# Patient Record
Sex: Female | Born: 1960 | Hispanic: No | Marital: Married | Smoking: Never smoker
Health system: Southern US, Community
[De-identification: ages and names within clinical notes are randomized; demographics above are authoritative.]

## PROBLEM LIST (undated history)

## (undated) DIAGNOSIS — Z789 Other specified health status: Secondary | ICD-10-CM

---

## 2000-10-21 ENCOUNTER — Other Ambulatory Visit: Admission: RE | Admit: 2000-10-21 | Discharge: 2000-10-21 | Payer: Self-pay | Admitting: Obstetrics & Gynecology

## 2001-02-04 ENCOUNTER — Ambulatory Visit (HOSPITAL_COMMUNITY): Admission: RE | Admit: 2001-02-04 | Discharge: 2001-02-04 | Payer: Self-pay | Admitting: Obstetrics & Gynecology

## 2001-04-23 ENCOUNTER — Inpatient Hospital Stay (HOSPITAL_COMMUNITY): Admission: AD | Admit: 2001-04-23 | Discharge: 2001-04-25 | Payer: Self-pay | Admitting: Obstetrics & Gynecology

## 2006-02-07 ENCOUNTER — Ambulatory Visit: Payer: Self-pay | Admitting: Family Medicine

## 2007-04-15 ENCOUNTER — Inpatient Hospital Stay (HOSPITAL_COMMUNITY): Admission: AD | Admit: 2007-04-15 | Discharge: 2007-04-16 | Payer: Self-pay | Admitting: Obstetrics & Gynecology

## 2007-04-24 ENCOUNTER — Ambulatory Visit: Payer: Self-pay | Admitting: Obstetrics & Gynecology

## 2007-04-24 ENCOUNTER — Ambulatory Visit (HOSPITAL_COMMUNITY): Admission: RE | Admit: 2007-04-24 | Discharge: 2007-04-24 | Payer: Self-pay | Admitting: Obstetrics and Gynecology

## 2007-04-24 ENCOUNTER — Encounter: Payer: Self-pay | Admitting: Obstetrics & Gynecology

## 2007-05-27 ENCOUNTER — Encounter: Payer: Self-pay | Admitting: Obstetrics & Gynecology

## 2007-05-27 ENCOUNTER — Ambulatory Visit: Payer: Self-pay | Admitting: Obstetrics & Gynecology

## 2007-06-15 ENCOUNTER — Ambulatory Visit (HOSPITAL_COMMUNITY): Admission: RE | Admit: 2007-06-15 | Discharge: 2007-06-15 | Payer: Self-pay | Admitting: Gynecology

## 2010-01-23 ENCOUNTER — Emergency Department (HOSPITAL_COMMUNITY): Admission: EM | Admit: 2010-01-23 | Discharge: 2010-01-23 | Payer: Self-pay | Admitting: Emergency Medicine

## 2010-01-23 ENCOUNTER — Ambulatory Visit: Payer: Self-pay | Admitting: Vascular Surgery

## 2010-08-02 LAB — URINALYSIS, ROUTINE W REFLEX MICROSCOPIC
Bilirubin Urine: NEGATIVE
Ketones, ur: NEGATIVE mg/dL
Protein, ur: NEGATIVE mg/dL
Specific Gravity, Urine: 1.006 (ref 1.005–1.030)
Urobilinogen, UA: 0.2 mg/dL (ref 0.0–1.0)

## 2010-08-02 LAB — BASIC METABOLIC PANEL
Calcium: 8.9 mg/dL (ref 8.4–10.5)
Chloride: 107 mEq/L (ref 96–112)
GFR calc Af Amer: >60 mL/min (ref >60)
Glucose, Bld: 92 mg/dL (ref 70–99)
Potassium: 3.8 mEq/L (ref 3.5–5.1)

## 2010-08-02 LAB — CBC
Hemoglobin: 12.7 g/dL (ref 12.0–15.0)
MCH: 29.8 pg (ref 26.0–34.0)
RDW: 13.9 % (ref 11.5–15.5)
WBC: 6.5 10*3/uL (ref 4.0–10.5)

## 2010-08-02 LAB — DIFFERENTIAL
Basophils Absolute: 0.1 10*3/uL (ref 0.0–0.1)
Basophils Relative: 2 % — ABNORMAL HIGH (ref 0–1)
Eosinophils Absolute: 0 10*3/uL (ref 0.0–0.7)
Lymphs Abs: 2.5 10*3/uL (ref 0.7–4.0)
Neutro Abs: 3.5 10*3/uL (ref 1.7–7.7)
Neutrophils Relative %: 55 % (ref 43–77)

## 2010-10-02 NOTE — Group Therapy Note (Signed)
[name], [name] NO.:  [id number]   MEDICAL RECORD NO.:  [id number]          PATIENT TYPE:  WOC   LOCATION:  [facility]                   FACILITY:  [facility]   PHYSICIAN:  Johnella Moloney, MD        DATE OF BIRTH:  05/20/1961   DATE OF SERVICE:  05/27/2007                                  CLINIC NOTE   REASON FOR TODAY'S VISIT:  The patient is status post a dilation and  curettage on April 24, 2007, for a 10 week missed abortion.  The  patient is also here for her annual exam.   HISTORY OF PRESENT ILLNESS:  The patient is a 50 year old G86, P 3-0-1-3  who was diagnosed with a 10-week missed abortion in December and  underwent an uncomplicated dilation and curettage on April 24, 2007.  For further details of this surgery, please refer to separate dictated  operative report. Since the surgery, the patient reports having a small  amount of bleeding and denies any pain or any other symptoms. The  patient today is interested in contraception.  She is interested in  using the IUD. All the patient has no other concerns at this time.   PAST MEDICAL HISTORY:  None.   PAST SURGICAL HISTORY:  A D&C on April 24, 2007.   PAST OB/GYN HISTORY:  G4 P3, three vaginal deliveries. Patient with  normal menstrual periods.  No intermenstrual bleeding.  Last menstrual  period was May 21, 2007.   MEDICATIONS:  None.   ALLERGIES:  No known drug allergies.   SOCIAL HISTORY:  The patient lives with her family. She does not work  and she denies any smoking, alcohol or illicit drug use.  Denies any  sexual or physical abuse either currently or in the past.   REVIEW OF SYSTEMS:  On a 14 point review of systems the patient has no  concerns.   PHYSICAL EXAMINATION:  Temperature 99.0, pulse 84, blood pressure  142/92, weight 273 pounds, height 5 feet, 7 inches.  GENERAL: No apparent distress.  LUNGS:  Clear to auscultation bilaterally.  HEART:  Regular rate and rhythm.  BREASTS:  Pendulous, symmetric, no abnormal masses or skin changes seen.  No abnormal drainage.  No axillary lymphadenopathy.  ABDOMEN:  Obese, soft, nontender, nondistended.  PELVIC:  Normal.  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 Otherwise normal anatomy. On speculum exam well rugated, pink.  Vagina multiparous cervical os with small amount of discharge noted from  cervical os consistent with old blood and mucus.  The patient recently  had a menstrual period. Pap smear sample obtained.  Bimanual exam, the  patient noted to have retroverted uterus.  Unable to palpate adnexa  secondary to habitus   ASSESSMENT/PLAN:  The patient is a 50 year old G4, P3-0-1-3 who is  status post a D&C for missed AB on April 24, 2007.  The patient is  also here for annual exam. As for annual exam, Pap smear was done today.  The patient would also be  scheduled for mammogram after this visit. For  her D&C, for her SAB, the patient has opted for contraception at this  point in the form of IUD.  She was given information about the ParaGard  and Mirena.  The patient opts for the Mirena. A gonorrhea, chlamydia  will be reflexively checked from the Pap smear sample. The patient has  been sexually active since her D&C and she was told to call with her  next menstrual period for a Mirena IUD insertion.  The patient  understands that she needs to use contraception.  She says she will be  using condoms in the meantime until she gets a Mirena IUD. For a  postoperative standpoint, the patient is stable.           ______________________________  Johnella Moloney, MD     UD/MEDQ  D:  05/27/2007  T:  05/27/2007  Job:  [id number]

## 2010-10-02 NOTE — Op Note (Signed)
[name], [name]                ACCOUNT NO.:  [id number]   MEDICAL RECORD NO.:  [id number]          PATIENT TYPE:  AMB   LOCATION:  [facility]                           [facility]ACILITY:  [facility]   PHYSICIAN:  Norton Blizzard, MD    DATE OF BIRTH:  1960-12-13   DATE OF PROCEDURE:  04/24/2007  DATE OF DISCHARGE:                               OPERATIVE REPORT   PREOPERATIVE DIAGNOSIS:  Missed abortion at [redacted] weeks gestational age.   POSTOPERATIVE DIAGNOSIS:  Missed abortion at [redacted] weeks gestational age.   PROCEDURE:  Suction dilation and curettage.   SURGEON:  Johnella Moloney, MD.   ANESTHESIA:  General and a local using 0.25% Marcaine with epinephrine,  a total of 20 ml was used.   INTRAVENOUS FLUIDS:  1100 ml of lactated Ringer's.   ESTIMATED BLOOD LOSS:  100 ml.   FINDINGS:  A 10-week size anteverted uterus, a moderate amount of  products of conception.   SPECIMENS:  Products of conception sent to Pathology.   COMPLICATIONS:  None.   CONDITION/DISPOSITION:  Stable to PACU.   PROCEDURE DETAIL:  The patient was a 50 year old female, who was  recently diagnosed with a 10-week missed abortion.  The patient was  given options for management and she chose for surgical evacuation of  the missed abortion.  The risks, benefits, indications, and alternatives  of the procedure were discussed with the patient and informed consent  was obtained.  On the day of surgery, the patient did receive  doxycycline 100 mg IV x1 preoperatively.  She was taken to the operating  room where general anesthesia was placed without difficulty.  She was  then placed in a dorsal lithotomy position and prepped and draped in a  sterile manner.  The patient's bladder was then emptied with the use of  a Foley catheter.  Attention was turned to the patient's pelvis where a  vaginal speculum was placed.  The cervix was identified and the anterior  lip of the cervix was grasped with a tenaculum.  A paracervical block  using 0.25%  Marcaine with epinephrine was administered.  The cervical os  was dilated with Pratt dilators to accommodate the 10 mm curved curette.  The 10 mm curved curette was then advanced gently into the uterine  fundus and a suction device was activated.  The curette was slowly  rotated and used to clear the uterus of all products of conception and  debris.  The uterus was noted to be clamped down and had a gritty  texture.  This was confirmed using sharp curettage, and the suction  curette was then introduced one last time to clear all remaining clots  and debris. The tenaculum was removed from the anterior lip of the  cervix.  There was a mild amount of  bleeding noted, which was controlled using pressure.  Overall good  hemostasis was noted.  All instruments were removed from the patient's  vagina.  The patient tolerated the procedure well.  Sponge, lap, needle,  and instrument counts were correct x2.  She was taken to the recovery  room in stable condition.      Norton Blizzard, MD  Electronically Signed     UAD/MEDQ  D:  04/24/2007  T:  04/24/2007  Job:  [id number]

## 2011-02-25 LAB — RH IMMUNE GLOBULIN WORKUP (NOT WOMEN'S HOSP): Antibody Screen: NEGATIVE

## 2011-02-25 LAB — CBC
HCT: 34 — ABNORMAL LOW
MCHC: 34.9
RDW: 14
WBC: 5.1

## 2011-02-26 LAB — CBC
MCHC: 34.4
MCV: 84.6
Platelets: 391
WBC: 7.4

## 2011-02-26 LAB — WET PREP, GENITAL: Yeast Wet Prep HPF POC: NONE SEEN

## 2011-08-10 ENCOUNTER — Ambulatory Visit (INDEPENDENT_AMBULATORY_CARE_PROVIDER_SITE_OTHER): Payer: BC Managed Care – PPO | Admitting: Family Medicine

## 2011-08-10 VITALS — BP 131/83 | HR 81 | Temp 98.5°F | Resp 20 | Ht 66.0 in | Wt 252.0 lb

## 2011-08-10 DIAGNOSIS — R1013 Epigastric pain: Secondary | ICD-10-CM

## 2011-08-10 DIAGNOSIS — R110 Nausea: Secondary | ICD-10-CM

## 2011-08-10 LAB — COMPREHENSIVE METABOLIC PANEL
ALT: 8 U/L (ref 0–35)
AST: 10 U/L (ref 0–37)
Albumin: 4 g/dL (ref 3.5–5.2)
Glucose, Bld: 100 mg/dL — ABNORMAL HIGH (ref 70–99)
Total Protein: 7.2 g/dL (ref 6.0–8.3)

## 2011-08-10 LAB — POCT CBC
HCT, POC: 34.7 % — AB (ref 37.7–47.9)
Lymph, poc: 1.9 (ref 0.6–3.4)
MCH, POC: 27.4 pg (ref 27–31.2)
MCHC: 31.7 g/dL — AB (ref 31.8–35.4)
MCV: 86.2 fL (ref 80–97)
MID (cbc): 0.3 (ref 0–0.9)
POC Granulocyte: 3.7 (ref 2–6.9)
POC MID %: 5.4 %M (ref 0–12)
RDW, POC: 13.6 %
WBC: 6 10*3/uL (ref 4.6–10.2)

## 2011-08-10 MED ORDER — GI COCKTAIL ~~LOC~~
30.0000 mL | Freq: Once | ORAL | Status: AC
  Administered 2011-08-10: 30 mL via ORAL

## 2011-08-10 MED ORDER — ONDANSETRON 4 MG PO TBDP
4.0000 mg | ORAL_TABLET | Freq: Once | ORAL | Status: AC
  Administered 2011-08-10: 4 mg via ORAL

## 2011-08-10 MED ORDER — PROMETHAZINE HCL 25 MG PO TABS: 25.0000 mg | ORAL_TABLET | Freq: Three times a day (TID) | ORAL | Status: AC | PRN

## 2011-08-10 MED ORDER — ESOMEPRAZOLE MAGNESIUM 40 MG PO CPDR: 40.0000 mg | DELAYED_RELEASE_CAPSULE | Freq: Every day | ORAL | Status: DC

## 2011-08-10 MED ORDER — HYDROCODONE-ACETAMINOPHEN 5-500 MG PO TABS: 1.0000 | ORAL_TABLET | Freq: Three times a day (TID) | ORAL | Status: DC | PRN

## 2011-08-10 NOTE — Progress Notes (Signed)
  Subjective:    Patient ID: [name], female    DOB: 05-06-1961, 51 y.o.   MRN: [id number]  HPI 51 yo healthy  female with abdominal complaints. Abdominal pain, epigastric, severe.  Decreased appetite.  Associated with nausea and diarrhea.  Started 2 nights ago, mild.  Worse since yesterday morning.  Took a tylenol, not much help.  Pain is a pulling, stretching pain.  Feels in back as well.  No alcohol.  No NSAID's.  Normal BM's.  No fever.  Pain does not radiate.  No history of abdominal surgeries.    Review of Systems Negative except as per HPI     Objective:   Physical Exam  Constitutional: Vital signs are normal. She appears well-developed and well-nourished. She is active.  Cardiovascular: Normal rate, regular rhythm, normal heart sounds and normal pulses.   Pulmonary/Chest: Effort normal and breath sounds normal.  Abdominal: Soft. Normal appearance and bowel sounds are normal. She exhibits no distension and no mass. There is no hepatosplenomegaly. There is tenderness in the right upper quadrant and epigastric area. There is no rigidity, no rebound, no guarding, no CVA tenderness, no tenderness at McBurney's point and negative Murphy's sign. No hernia.  Neurological: She is alert.   Results for orders placed in visit on 08/10/11  POCT CBC      Component Value Range   WBC 6.0  4.6 - 10.2 (K/uL)   Lymph, poc 1.9  0.6 - 3.4    POC LYMPH PERCENT 32.3  10 - 50 (%L)   MID (cbc) 0.3  0 - 0.9    POC MID % 5.4  0 - 12 (%M)   POC Granulocyte 3.7  2 - 6.9    Granulocyte percent 62.3  37 - 80 (%G)   RBC 4.02 (*) 4.04 - 5.48 (M/uL)   Hemoglobin 11.0 (*) 12.2 - 16.2 (g/dL)   HCT, POC 95.2 (*) 84.1 - 47.9 (%)   MCV 86.2  80 - 97 (fL)   MCH, POC 27.4  27 - 31.2 (pg)   MCHC 31.7 (*) 31.8 - 35.4 (g/dL)   RDW, POC 32.4     Platelet Count, POC 380  142 - 424 (K/uL)   MPV 8.5  0 - 99.8 (fL)    Did not improve with GI cocktail or zofran.        Assessment & Plan:  Epigastric and RUQ  pain.  Normal CBC.  Gastritis (though did not improve with GI cocktail) vs pancreatitis vs gall bladder disease.  Obtain abd u/s.  Vicodin, nexium and phenergan for symptoms in the meantime.

## 2011-08-16 ENCOUNTER — Ambulatory Visit
Admission: RE | Admit: 2011-08-16 | Discharge: 2011-08-16 | Disposition: A | Discharge status: Home / Self Care | Payer: No Typology Code available for payment source | Source: Ambulatory Visit | Attending: Family Medicine | Admitting: Family Medicine

## 2011-08-16 ENCOUNTER — Encounter (HOSPITAL_COMMUNITY): Admission: EM | Disposition: A | Discharge status: Home / Self Care | Payer: Self-pay | Source: Home / Self Care

## 2011-08-16 ENCOUNTER — Inpatient Hospital Stay (HOSPITAL_COMMUNITY): Payer: BC Managed Care – PPO | Admitting: Anesthesiology

## 2011-08-16 ENCOUNTER — Inpatient Hospital Stay (HOSPITAL_COMMUNITY): Payer: BC Managed Care – PPO

## 2011-08-16 ENCOUNTER — Encounter (HOSPITAL_COMMUNITY): Payer: Self-pay | Admitting: Anesthesiology

## 2011-08-16 ENCOUNTER — Other Ambulatory Visit: Payer: Self-pay

## 2011-08-16 ENCOUNTER — Inpatient Hospital Stay (HOSPITAL_COMMUNITY)
Admission: EM | Admit: 2011-08-16 | Discharge: 2011-08-17 | DRG: 494 | Disposition: A | Discharge status: Home / Self Care | Payer: BC Managed Care – PPO | Attending: Surgery | Admitting: Surgery

## 2011-08-16 ENCOUNTER — Telehealth: Payer: Self-pay | Admitting: Family Medicine

## 2011-08-16 ENCOUNTER — Encounter (HOSPITAL_COMMUNITY): Payer: Self-pay | Admitting: Surgery

## 2011-08-16 DIAGNOSIS — R1013 Epigastric pain: Secondary | ICD-10-CM

## 2011-08-16 DIAGNOSIS — K8000 Calculus of gallbladder with acute cholecystitis without obstruction: Secondary | ICD-10-CM

## 2011-08-16 DIAGNOSIS — K8010 Calculus of gallbladder with chronic cholecystitis without obstruction: Principal | ICD-10-CM | POA: Diagnosis present

## 2011-08-16 HISTORY — PX: CHOLECYSTECTOMY: SHX55

## 2011-08-16 HISTORY — DX: Other specified health status: Z78.9

## 2011-08-16 LAB — CBC
MCV: 86.9 fL (ref 78.0–100.0)
Platelets: 408 10*3/uL — ABNORMAL HIGH (ref 150–400)
RBC: 4.5 MIL/uL (ref 3.87–5.11)
RDW: 12.8 % (ref 11.5–15.5)
WBC: 5.3 10*3/uL (ref 4.0–10.5)

## 2011-08-16 LAB — DIFFERENTIAL
Basophils Absolute: 0 10*3/uL (ref 0.0–0.1)
Lymphocytes Relative: 45 % (ref 12–46)
Lymphs Abs: 2.4 10*3/uL (ref 0.7–4.0)
Neutro Abs: 2.4 10*3/uL (ref 1.7–7.7)
Neutrophils Relative %: 45 % (ref 43–77)

## 2011-08-16 LAB — COMPREHENSIVE METABOLIC PANEL
ALT: 8 U/L (ref 0–35)
AST: 12 U/L (ref 0–37)
Alkaline Phosphatase: 59 U/L (ref 39–117)
CO2: 27 mEq/L (ref 19–32)
Calcium: 9.6 mg/dL (ref 8.4–10.5)
GFR calc Af Amer: >90 mL/min (ref >90)
GFR calc non Af Amer: >90 mL/min (ref >90)
Glucose, Bld: 81 mg/dL (ref 70–99)
Potassium: 3.8 mEq/L (ref 3.5–5.1)
Sodium: 135 mEq/L (ref 135–145)

## 2011-08-16 SURGERY — LAPAROSCOPIC CHOLECYSTECTOMY WITH INTRAOPERATIVE CHOLANGIOGRAM
Anesthesia: General | Site: Abdomen | Wound class: Dirty or Infected

## 2011-08-16 MED ORDER — ACETAMINOPHEN 10 MG/ML IV SOLN
INTRAVENOUS | Status: DC | PRN
  Administered 2011-08-16: 1000 mg via INTRAVENOUS

## 2011-08-16 MED ORDER — SUCCINYLCHOLINE CHLORIDE 20 MG/ML IJ SOLN
INTRAMUSCULAR | Status: DC | PRN
  Administered 2011-08-16: 100 mg via INTRAVENOUS

## 2011-08-16 MED ORDER — MIDAZOLAM HCL 5 MG/5ML IJ SOLN
INTRAMUSCULAR | Status: DC | PRN
  Administered 2011-08-16: 2 mg via INTRAVENOUS

## 2011-08-16 MED ORDER — STERILE WATER FOR IRRIGATION IR SOLN
Status: DC | PRN
  Administered 2011-08-16: 1500 mL

## 2011-08-16 MED ORDER — ONDANSETRON HCL 4 MG/2ML IJ SOLN: 4.0000 mg | Freq: Four times a day (QID) | INTRAMUSCULAR | Status: DC | PRN

## 2011-08-16 MED ORDER — HYDROMORPHONE HCL PF 1 MG/ML IJ SOLN
INTRAMUSCULAR | Status: DC | PRN
  Administered 2011-08-16 (×2): 1 mg via INTRAVENOUS

## 2011-08-16 MED ORDER — HYDROCODONE-ACETAMINOPHEN 10-325 MG PO TABS: 2.0000 | ORAL_TABLET | ORAL | Status: DC | PRN

## 2011-08-16 MED ORDER — HYDROMORPHONE HCL PF 1 MG/ML IJ SOLN
INTRAMUSCULAR | Status: AC
  Administered 2011-08-16: 0.5 mg via INTRAVENOUS
  Filled 2011-08-16: qty 1

## 2011-08-16 MED ORDER — PANTOPRAZOLE SODIUM 40 MG PO TBEC
40.0000 mg | DELAYED_RELEASE_TABLET | Freq: Every day | ORAL | Status: DC
  Administered 2011-08-16: 40 mg via ORAL
  Filled 2011-08-16 (×2): qty 1

## 2011-08-16 MED ORDER — BUPIVACAINE-EPINEPHRINE PF 0.25-1:200000 % IJ SOLN
INTRAMUSCULAR | Status: AC
  Filled 2011-08-16: qty 30

## 2011-08-16 MED ORDER — ACETAMINOPHEN 10 MG/ML IV SOLN
INTRAVENOUS | Status: AC
  Filled 2011-08-16: qty 100

## 2011-08-16 MED ORDER — DROPERIDOL 2.5 MG/ML IJ SOLN
INTRAMUSCULAR | Status: DC | PRN
  Administered 2011-08-16: 0.625 mg via INTRAVENOUS

## 2011-08-16 MED ORDER — IOHEXOL 300 MG/ML  SOLN
INTRAMUSCULAR | Status: AC
  Filled 2011-08-16: qty 1

## 2011-08-16 MED ORDER — ONDANSETRON HCL 4 MG/2ML IJ SOLN
4.0000 mg | Freq: Once | INTRAMUSCULAR | Status: AC
Start: 2011-08-16 — End: 2011-08-16
  Administered 2011-08-16: 4 mg via INTRAVENOUS
  Filled 2011-08-16: qty 2

## 2011-08-16 MED ORDER — NEOSTIGMINE METHYLSULFATE 1 MG/ML IJ SOLN
INTRAMUSCULAR | Status: DC | PRN
  Administered 2011-08-16: 5 mg via INTRAVENOUS

## 2011-08-16 MED ORDER — IOHEXOL 300 MG/ML  SOLN
INTRAMUSCULAR | Status: DC | PRN
  Administered 2011-08-16: 6.5 mL via INTRAVENOUS

## 2011-08-16 MED ORDER — LACTATED RINGERS IV SOLN
INTRAVENOUS | Status: DC | PRN
  Administered 2011-08-16 (×2): via INTRAVENOUS

## 2011-08-16 MED ORDER — LACTATED RINGERS IR SOLN
Status: DC | PRN
  Administered 2011-08-16 (×2): 1000 mL

## 2011-08-16 MED ORDER — 0.9 % SODIUM CHLORIDE (POUR BTL) OPTIME
TOPICAL | Status: DC | PRN
  Administered 2011-08-16: 1000 mL

## 2011-08-16 MED ORDER — MORPHINE SULFATE 2 MG/ML IJ SOLN: 1.0000 mg | INTRAMUSCULAR | Status: DC | PRN

## 2011-08-16 MED ORDER — KCL IN DEXTROSE-NACL 10-5-0.45 MEQ/L-%-% IV SOLN
INTRAVENOUS | Status: DC
  Filled 2011-08-16 (×4): qty 1000

## 2011-08-16 MED ORDER — LACTATED RINGERS IV SOLN: INTRAVENOUS | Status: DC

## 2011-08-16 MED ORDER — FENTANYL CITRATE 0.05 MG/ML IJ SOLN
INTRAMUSCULAR | Status: DC | PRN
  Administered 2011-08-16: 50 ug via INTRAVENOUS
  Administered 2011-08-16 (×2): 100 ug via INTRAVENOUS
  Administered 2011-08-16 (×2): 50 ug via INTRAVENOUS

## 2011-08-16 MED ORDER — KETOROLAC TROMETHAMINE 30 MG/ML IJ SOLN
30.0000 mg | Freq: Four times a day (QID) | INTRAMUSCULAR | Status: DC
  Administered 2011-08-17 (×2): 30 mg via INTRAVENOUS
  Filled 2011-08-16 (×6): qty 1

## 2011-08-16 MED ORDER — HYDROMORPHONE HCL PF 1 MG/ML IJ SOLN
1.0000 mg | Freq: Once | INTRAMUSCULAR | Status: AC
  Administered 2011-08-16: 1 mg via INTRAVENOUS

## 2011-08-16 MED ORDER — KCL IN DEXTROSE-NACL 10-5-0.45 MEQ/L-%-% IV SOLN
INTRAVENOUS | Status: DC
  Administered 2011-08-16: 23:00:00 via INTRAVENOUS
  Filled 2011-08-16 (×3): qty 1000

## 2011-08-16 MED ORDER — HYDROMORPHONE HCL PF 1 MG/ML IJ SOLN
INTRAMUSCULAR | Status: AC
Start: 2011-08-16 — End: 2011-08-16
  Administered 2011-08-16: 1 mg via INTRAVENOUS
  Filled 2011-08-16: qty 1

## 2011-08-16 MED ORDER — ENOXAPARIN SODIUM 40 MG/0.4ML ~~LOC~~ SOLN
40.0000 mg | SUBCUTANEOUS | Status: DC
  Administered 2011-08-17: 40 mg via SUBCUTANEOUS
  Filled 2011-08-16 (×3): qty 0.4

## 2011-08-16 MED ORDER — PROPOFOL 10 MG/ML IV EMUL
INTRAVENOUS | Status: DC | PRN
  Administered 2011-08-16: 150 mg via INTRAVENOUS

## 2011-08-16 MED ORDER — DEXAMETHASONE SODIUM PHOSPHATE 10 MG/ML IJ SOLN
INTRAMUSCULAR | Status: DC | PRN
  Administered 2011-08-16: 10 mg via INTRAVENOUS

## 2011-08-16 MED ORDER — GLYCOPYRROLATE 0.2 MG/ML IJ SOLN
INTRAMUSCULAR | Status: DC | PRN
  Administered 2011-08-16: .6 mg via INTRAVENOUS

## 2011-08-16 MED ORDER — ENOXAPARIN SODIUM 40 MG/0.4ML ~~LOC~~ SOLN: 40.0000 mg | SUBCUTANEOUS | Status: DC

## 2011-08-16 MED ORDER — ONDANSETRON HCL 4 MG/2ML IJ SOLN
INTRAMUSCULAR | Status: DC | PRN
  Administered 2011-08-16: 4 mg via INTRAVENOUS

## 2011-08-16 MED ORDER — HYDROMORPHONE HCL PF 1 MG/ML IJ SOLN
1.0000 mg | INTRAMUSCULAR | Status: DC | PRN
  Administered 2011-08-17: 1 mg via INTRAVENOUS
  Filled 2011-08-16: qty 1

## 2011-08-16 MED ORDER — LIDOCAINE HCL (CARDIAC) 20 MG/ML IV SOLN
INTRAVENOUS | Status: DC | PRN
  Administered 2011-08-16: 100 mg via INTRAVENOUS

## 2011-08-16 MED ORDER — HYDROMORPHONE HCL PF 1 MG/ML IJ SOLN
0.2500 mg | INTRAMUSCULAR | Status: DC | PRN
  Administered 2011-08-16: 0.5 mg via INTRAVENOUS
  Administered 2011-08-16 (×2): 0.25 mg via INTRAVENOUS

## 2011-08-16 MED ORDER — SODIUM CHLORIDE 0.9 % IV SOLN
Freq: Once | INTRAVENOUS | Status: AC
  Administered 2011-08-16: 20 mL via INTRAVENOUS

## 2011-08-16 MED ORDER — SODIUM CHLORIDE 0.9 % IV SOLN
3.0000 g | Freq: Four times a day (QID) | INTRAVENOUS | Status: DC
  Administered 2011-08-16 – 2011-08-17 (×2): 3 g via INTRAVENOUS
  Filled 2011-08-16 (×7): qty 3

## 2011-08-16 MED ORDER — SODIUM CHLORIDE 0.9 % IV SOLN
3.0000 g | Freq: Once | INTRAVENOUS | Status: AC
  Administered 2011-08-16 (×2): 3 g via INTRAVENOUS
  Filled 2011-08-16: qty 3

## 2011-08-16 MED ORDER — ROCURONIUM BROMIDE 100 MG/10ML IV SOLN
INTRAVENOUS | Status: DC | PRN
  Administered 2011-08-16: 5 mg via INTRAVENOUS
  Administered 2011-08-16: 10 mg via INTRAVENOUS
  Administered 2011-08-16: 30 mg via INTRAVENOUS
  Administered 2011-08-16: 10 mg via INTRAVENOUS

## 2011-08-16 MED ORDER — BUPIVACAINE-EPINEPHRINE PF 0.25-1:200000 % IJ SOLN
INTRAMUSCULAR | Status: DC | PRN
  Administered 2011-08-16: 15 mL

## 2011-08-16 SURGICAL SUPPLY — 46 items
APPLIER CLIP ROT 10 11.4 M/L (STAPLE) ×2
CANISTER SUCTION 2500CC (MISCELLANEOUS) ×2 IMPLANT
CHLORAPREP W/TINT 26ML (MISCELLANEOUS) ×4 IMPLANT
CLIP APPLIE ROT 10 11.4 M/L (STAPLE) ×1 IMPLANT
CLOTH BEACON ORANGE TIMEOUT ST (SAFETY) ×2 IMPLANT
COVER MAYO STAND STRL (DRAPES) ×2 IMPLANT
COVER SURGICAL LIGHT HANDLE (MISCELLANEOUS) ×2 IMPLANT
DECANTER SPIKE VIAL GLASS SM (MISCELLANEOUS) IMPLANT
DERMABOND ADVANCED (GAUZE/BANDAGES/DRESSINGS) ×1
DERMABOND ADVANCED .7 DNX12 (GAUZE/BANDAGES/DRESSINGS) ×1 IMPLANT
DISSECTOR BLUNT TIP ENDO 5MM (MISCELLANEOUS) ×2 IMPLANT
DRAIN CHANNEL RND F F (WOUND CARE) ×2 IMPLANT
DRAPE C-ARM 42X72 X-RAY (DRAPES) ×2 IMPLANT
DRAPE LAPAROSCOPIC ABDOMINAL (DRAPES) ×2 IMPLANT
DRAPE UTILITY XL STRL (DRAPES) ×2 IMPLANT
DRAPE WARM FLUID 44X44 (DRAPE) ×2 IMPLANT
ELECT REM PT RETURN 9FT ADLT (ELECTROSURGICAL) ×2
ELECTRODE REM PT RTRN 9FT ADLT (ELECTROSURGICAL) ×1 IMPLANT
EVACUATOR DRAINAGE 10X20 100CC (DRAIN) ×1 IMPLANT
EVACUATOR SILICONE 100CC (DRAIN) ×1
FILTER SMOKE EVAC LAPAROSHD (FILTER) ×2 IMPLANT
GLOVE BIOGEL PI IND STRL 7.0 (GLOVE) ×1 IMPLANT
GLOVE BIOGEL PI INDICATOR 7.0 (GLOVE) ×1
GLOVE INDICATOR 8.0 STRL GRN (GLOVE) ×4 IMPLANT
GLOVE SS BIOGEL STRL SZ 8 (GLOVE) ×1 IMPLANT
GLOVE SUPERSENSE BIOGEL SZ 8 (GLOVE) ×1
GOWN STRL NON-REIN LRG LVL3 (GOWN DISPOSABLE) ×2 IMPLANT
GOWN STRL REIN XL XLG (GOWN DISPOSABLE) ×4 IMPLANT
HEMOSTAT SNOW SURGICEL 2X4 (HEMOSTASIS) ×2 IMPLANT
HEMOSTAT SURGICEL 4X8 (HEMOSTASIS) IMPLANT
KIT BASIN OR (CUSTOM PROCEDURE TRAY) ×2 IMPLANT
POUCH SPECIMEN RETRIEVAL 10MM (ENDOMECHANICALS) ×2 IMPLANT
SCISSORS LAP 5X35 DISP (ENDOMECHANICALS) ×2 IMPLANT
SET CHOLANGIOGRAPH MIX (MISCELLANEOUS) ×2 IMPLANT
SET IRRIG TUBING LAPAROSCOPIC (IRRIGATION / IRRIGATOR) ×2 IMPLANT
SPONGE DRAIN TRACH 4X4 STRL 2S (GAUZE/BANDAGES/DRESSINGS) ×2 IMPLANT
SUT ETHILON 2 0 PS N (SUTURE) ×2 IMPLANT
SUT MNCRL AB 4-0 PS2 18 (SUTURE) ×2 IMPLANT
TAPE CLOTH SURG 4X10 WHT LF (GAUZE/BANDAGES/DRESSINGS) ×2 IMPLANT
TOWEL OR 17X26 10 PK STRL BLUE (TOWEL DISPOSABLE) ×2 IMPLANT
TOWEL OR NON WOVEN STRL DISP B (DISPOSABLE) ×2 IMPLANT
TRAY LAP CHOLE (CUSTOM PROCEDURE TRAY) ×2 IMPLANT
TROCAR BLADELESS OPT 5 75 (ENDOMECHANICALS) ×4 IMPLANT
TROCAR XCEL BLUNT TIP 100MML (ENDOMECHANICALS) ×2 IMPLANT
TROCAR XCEL NON-BLD 11X100MML (ENDOMECHANICALS) ×2 IMPLANT
TUBING INSUFFLATION 10FT LAP (TUBING) ×2 IMPLANT

## 2011-08-16 NOTE — ED Provider Notes (Signed)
History     CSN: [id number]  Arrival date & time 08/16/11  0849   First MD Initiated Contact with Patient 08/16/11 [phone]      No chief complaint on file.   (Consider location/radiation/quality/duration/timing/severity/associated sxs/prior treatment) HPI Comments: Patient presents with 8 days of intermittent right upper quadrant pain that can be severe at times. She's had some episodes of nausea and vomiting with the pain. No fevers. No diarrhea or urinary symptoms. Patient was seen in urgent care and prescribed Phenergan, Vicodin, and Prevacid. Daughter reports that she has been taking the Vicodin every 8 hours consistently. Last attack of pain was a 4 AM. Patient had an ultrasound performed this morning that showed a gallstone in the gallbladder neck with some mild thickening of the gallbladder. No pericholecystic fluid or obvious signs of cholecystitis. Her pain at current time is minimal.  Patient is a 51 y.o. female presenting with abdominal pain. The history is provided by the patient.  Abdominal Pain The primary symptoms of the illness include abdominal pain, nausea and vomiting. The primary symptoms of the illness do not include fever, shortness of breath, diarrhea or dysuria. The current episode started more than 2 days ago. The problem has been gradually worsening.  The patient has not had a change in bowel habit. Symptoms associated with the illness do not include chills, constipation, frequency or back pain. Significant associated medical issues include gallstones.    No past medical history on file.  No past surgical history on file.  No family history on file.  History  Substance Use Topics  . Smoking status: Never Smoker   . Smokeless tobacco: Not on file  . Alcohol Use: Not on file    OB History    Grav Para Term Preterm Abortions TAB SAB Ect Mult Living                  Review of Systems  Constitutional: Negative for fever and chills.  HENT: Negative for sore  throat and rhinorrhea.   Eyes: Negative for redness.  Respiratory: Negative for cough and shortness of breath.   Cardiovascular: Negative for chest pain.  Gastrointestinal: Positive for nausea, vomiting and abdominal pain. Negative for diarrhea and constipation.  Genitourinary: Negative for dysuria and frequency.  Musculoskeletal: Negative for myalgias and back pain.  Skin: Negative for rash.  Neurological: Negative for headaches.    Allergies  Review of patient's allergies indicates no known allergies.  Home Medications   Current Outpatient Rx  Name Route Sig Dispense Refill  . ACETAMINOPHEN 500 MG PO CHEW Oral Chew 500 mg by mouth every 4 (four) hours.    Marland Kitchen HYDROCODONE-ACETAMINOPHEN 5-500 MG PO TABS Oral Take 1 tablet by mouth every 8 (eight) hours as needed. For pain    . LANSOPRAZOLE 30 MG PO CPDR Oral Take 30 mg by mouth daily.    Marland Kitchen PROMETHAZINE HCL 25 MG PO TABS Oral Take 1 tablet (25 mg total) by mouth every 8 (eight) hours as needed for nausea. 20 tablet 0    BP 141/98  Pulse 92  Temp(Src) 97.8 F (36.6 C) (Oral)  Resp 18  SpO2 100%  LMP 08/01/2011  Physical Exam  Nursing note and vitals reviewed. Constitutional: She is oriented to person, place, and time. She appears well-developed and well-nourished.  HENT:  Head: Normocephalic and atraumatic.  Eyes: Conjunctivae are normal. Right eye exhibits no discharge. Left eye exhibits no discharge.  Neck: Normal range of motion. Neck supple.  Cardiovascular: Normal  rate, regular rhythm and normal heart sounds.   Pulmonary/Chest: Effort normal and breath sounds normal.  Abdominal: Soft. Bowel sounds are normal. There is tenderness in the right upper quadrant. There is no rebound, no guarding, no CVA tenderness, no tenderness at McBurney's point and negative Murphy's sign.  Neurological: She is alert and oriented to person, place, and time.  Skin: Skin is warm and dry.  Psychiatric: She has a normal mood and affect.    ED  Course  Procedures (including critical care time)  Labs Reviewed  CBC - Abnormal; Notable for the following:    Platelets 408 (*)    All other components within normal limits  COMPREHENSIVE METABOLIC PANEL - Abnormal; Notable for the following:    Total Protein 8.7 (*)    Total Bilirubin 0.2 (*)    All other components within normal limits  DIFFERENTIAL  LIPASE, BLOOD   US Abdomen Complete  08/16/2011  *RADIOLOGY REPORT*  Clinical Data:  Epigastric abdominal pain.  COMPLETE ABDOMINAL ULTRASOUND  Comparison:  None.  Findings:  Gallbladder:  Numerous gallstones.  These measure up to 3.2 cm.  A 2.3 cm stone is identified within the gallbladder neck.  The gallbladder wall is mildly thickened, at 5 mm.  No pericholecystic fluid. Sonographic Murphy's sign was not elicited.  Common bile duct: Normal, 3 mm.  Liver: Normal in echogenicity, without focal lesion.  IVC: Negative  Pancreas:  Negative  Spleen:  Normal in size and echogenicity.  Right Kidney:  11.1 cm. No hydronephrosis.  Left Kidney:  12.4 cm. No hydronephrosis.  Abdominal aorta:  Nonaneurysmal without ascites.  IMPRESSION:  1.  Gallstones, including a gallbladder neck stone.  Wall thickening, without specific evidence of acute cholecystitis. This study was made a "call report". 2.  No biliary ductal dilatation.  Original Report Authenticated By: Consuello Bossier, M.D.   1. Cholelithiasis     9:19 AM Patient seen and examined. Work-up initiated. Pt refusing medication at this time. Last oral intake last night. Will get labs. Anticipate calling surgery.   Vital signs reviewed and are as follows: Filed Vitals:   08/16/11 0856  BP: 141/98  Pulse: 92  Temp: 97.8 F (36.6 C)  Resp: 18   9:19 AM Patient was discussed with Geoffery Lyons, MD  11:06 AM Spoke with Dr. Luisa Hart. He will see in ED. Asked to give 3g Unasyn.   2:20 PM Surgery has seen and will admit.   MDM  Admit for cholecystectomy.         Renne Crigler, [place] 08/16/11  1420

## 2011-08-16 NOTE — Anesthesia Preprocedure Evaluation (Signed)
Anesthesia Evaluation  Patient identified by MRN, date of birth, ID band Patient awake  General Assessment Comment:NPO today  Reviewed: Allergy & Precautions, H&P , NPO status , Patient's Chart, lab work & pertinent test results, reviewed documented beta blocker date and time   Airway Mallampati: II TM Distance: >3 FB Neck ROM: Full    Dental  (+) Teeth Intact and Dental Advisory Given   Pulmonary neg pulmonary ROS,          Cardiovascular negative cardio ROS  Rhythm:Regular Rate:Normal  Denies cardiac symptoms   Neuro/Psych negative neurological ROS  negative psych ROS   GI/Hepatic Neg liver ROS, gallstones   Endo/Other  Obesity  Renal/GU negative Renal ROS  negative genitourinary   Musculoskeletal negative musculoskeletal ROS (+)   Abdominal   Peds negative pediatric ROS (+)  Hematology negative hematology ROS (+)   Anesthesia Other Findings   Reproductive/Obstetrics negative OB ROS                           Anesthesia Physical Anesthesia Plan  ASA: II  Anesthesia Plan: General   Post-op Pain Management:    Induction: Intravenous and Cricoid pressure planned  Airway Management Planned: Oral ETT  Additional Equipment:   Intra-op Plan:   Post-operative Plan: Extubation in OR  Informed Consent:   Dental advisory given  Plan Discussed with: CRNA and Surgeon  Anesthesia Plan Comments:         Anesthesia Quick Evaluation

## 2011-08-16 NOTE — Consult Note (Signed)
Reason for Consult:Abdominal pain and gallstones Referring Physician: Deloe  [name] is an 51 y.o. female.  HPI: Pt presents from urgent care with 1 day history of epigastric pain and nausea.  Pain is sharp 10/10 located in RUQ and epigastrium without radiation.  Pain made worse with eating.  Has had episodes before.  No past medical history on file.  No past surgical history on file.  No family history on file.  Social History:  reports that she has never smoked. She does not have any smokeless tobacco history on file. Her alcohol and drug histories not on file.  Allergies: No Known Allergies  Medications:  I have reviewed the patient's current medications. Prior to Admission:  (Not in a hospital admission) Scheduled:   . sodium chloride   Intravenous Once  . ampicillin-sulbactam (UNASYN) IV  3 g Intravenous Once  .  HYDROmorphone (DILAUDID) injection  1 mg Intravenous Once  . ondansetron  4 mg Intravenous Once   Continuous:  PRN:  Results for orders placed during the hospital encounter of 08/16/11 (from the past 48 hour(s))  CBC     Status: Abnormal   Collection Time   08/16/11  9:20 AM      Component Value Range Comment   WBC 5.3  4.0 - 10.5 (K/uL)    RBC 4.50  3.87 - 5.11 (MIL/uL)    Hemoglobin 13.0  12.0 - 15.0 (g/dL)    HCT 16.1  09.6 - 04.5 (%)    MCV 86.9  78.0 - 100.0 (fL)    MCH 28.9  26.0 - 34.0 (pg)    MCHC 33.2  30.0 - 36.0 (g/dL)    RDW 40.9  81.1 - 91.4 (%)    Platelets 408 (*) 150 - 400 (K/uL)   DIFFERENTIAL     Status: Normal   Collection Time   08/16/11  9:20 AM      Component Value Range Comment   Neutrophils Relative 45  43 - 77 (%)    Neutro Abs 2.4  1.7 - 7.7 (K/uL)    Lymphocytes Relative 45  12 - 46 (%)    Lymphs Abs 2.4  0.7 - 4.0 (K/uL)    Monocytes Relative 8  3 - 12 (%)    Monocytes Absolute 0.4  0.1 - 1.0 (K/uL)    Eosinophils Relative 2  0 - 5 (%)    Eosinophils Absolute 0.1  0.0 - 0.7 (K/uL)    Basophils Relative 0  0 - 1 (%)      Basophils Absolute 0.0  0.0 - 0.1 (K/uL)   COMPREHENSIVE METABOLIC PANEL     Status: Abnormal   Collection Time   08/16/11  9:20 AM      Component Value Range Comment   Sodium 135  135 - 145 (mEq/L)    Potassium 3.8  3.5 - 5.1 (mEq/L)    Chloride 99  96 - 112 (mEq/L)    CO2 27  19 - 32 (mEq/L)    Glucose, Bld 81  70 - 99 (mg/dL)    BUN 6  6 - 23 (mg/dL)    Creatinine, Ser 7.82  0.50 - 1.10 (mg/dL)    Calcium 9.6  8.4 - 10.5 (mg/dL)    Total Protein 8.7 (*) 6.0 - 8.3 (g/dL)    Albumin 3.6  3.5 - 5.2 (g/dL)    AST 12  0 - 37 (U/L)    ALT 8  0 - 35 (U/L)    Alkaline Phosphatase  59  39 - 117 (U/L)    Total Bilirubin 0.2 (*) 0.3 - 1.2 (mg/dL)    GFR calc non Af Amer >90  >90 (mL/min)    GFR calc Af Amer >90  >90 (mL/min)   LIPASE, BLOOD     Status: Normal   Collection Time   08/16/11  9:20 AM      Component Value Range Comment   Lipase 14  11 - 59 (U/L)     US Abdomen Complete  08/16/2011  *RADIOLOGY REPORT*  Clinical Data:  Epigastric abdominal pain.  COMPLETE ABDOMINAL ULTRASOUND  Comparison:  None.  Findings:  Gallbladder:  Numerous gallstones.  These measure up to 3.2 cm.  A 2.3 cm stone is identified within the gallbladder neck.  The gallbladder wall is mildly thickened, at 5 mm.  No pericholecystic fluid. Sonographic Murphy's sign was not elicited.  Common bile duct: Normal, 3 mm.  Liver: Normal in echogenicity, without focal lesion.  IVC: Negative  Pancreas:  Negative  Spleen:  Normal in size and echogenicity.  Right Kidney:  11.1 cm. No hydronephrosis.  Left Kidney:  12.4 cm. No hydronephrosis.  Abdominal aorta:  Nonaneurysmal without ascites.  IMPRESSION:  1.  Gallstones, including a gallbladder neck stone.  Wall thickening, without specific evidence of acute cholecystitis. This study was made a "call report". 2.  No biliary ductal dilatation.  Original Report Authenticated By: Consuello Bossier, M.D.    Review of Systems  Constitutional: Negative for fever and chills.  HENT:  Negative for hearing loss.   Eyes: Negative.   Respiratory: Negative for cough and hemoptysis.   Cardiovascular: Negative for chest pain and palpitations.  Gastrointestinal: Positive for nausea and abdominal pain. Negative for vomiting.  Genitourinary: Negative.   Musculoskeletal: Negative.   Skin: Negative for rash.  Neurological: Positive for headaches.  Endo/Heme/Allergies: Negative.   Psychiatric/Behavioral: Negative.    Blood pressure 108/71, pulse 78, temperature 98.7 F (37.1 C), temperature source Oral, resp. rate 18, last menstrual period 08/01/2011, SpO2 100.00%. Physical Exam  Constitutional: She appears well-developed and well-nourished.  HENT:  Head: Normocephalic and atraumatic.  Eyes: EOM are normal. Pupils are equal, round, and reactive to light.  Neck: Normal range of motion. Neck supple.  GI: Soft. There is tenderness. There is rebound. There is no guarding.    Skin: Skin is warm, dry and intact.  Psychiatric: She has a normal mood and affect. Her speech is normal and behavior is normal.    Assessment/Plan: Acute cholecystitis Admit IVF AND ABX Lap cholecystectomy with cholangiogram.The procedure has been discussed with the patient. Operative and non operative treatments have been discussed. Risks of surgery include bleeding, infection,  Common bile duct injury,  Injury to the stomach,liver, colon,small intestine, abdominal wall,  Diaphragm,  Major blood vessels,  And the need for an open procedure.  Other risks include worsening of medical problems, death,  DVT and pulmonary embolism, and cardiovascular events.   Medical options have also been discussed. The patient has been informed of long term expectations of surgery and non surgical options,  The patient agrees to proceed.    Trafton Roker A. 08/16/2011, 1:54 PM

## 2011-08-16 NOTE — Transfer of Care (Signed)
Immediate Anesthesia Transfer of Care Note  Patient: [name]  Procedure(s) Performed: Procedure(s) (LRB): LAPAROSCOPIC CHOLECYSTECTOMY WITH INTRAOPERATIVE CHOLANGIOGRAM (N/A)  Patient Location: PACU  Anesthesia Type: General  Level of Consciousness: awake, alert  and oriented  Airway & Oxygen Therapy: Patient Spontanous Breathing and Patient connected to face mask oxygen  Post-op Assessment: Report given to PACU RN and Post -op Vital signs reviewed and stable  Post vital signs: Reviewed and stable  Complications: No apparent anesthesia complications

## 2011-08-16 NOTE — Op Note (Signed)
Laparoscopic Cholecystectomy with IOC Procedure Note  Indications: This patient presents with symptomatic gallbladder disease and will undergo laparoscopic cholecystectomy.  Pre-operative Diagnosis: Calculus of gallbladder with acute cholecystitis, without mention of obstruction  Post-operative Diagnosis: Same  Surgeon: Jaelen Gellerman A.   Assistants: OR staff  Anesthesia: General endotracheal anesthesia and Local anesthesia 0.25.% bupivacaine, with epinephrine  ASA Class: 2  Procedure Details  The patient was seen again in the Holding Room. The risks, benefits, complications, treatment options, and expected outcomes were discussed with the patient. The possibilities of reaction to medication, pulmonary aspiration, perforation of viscus, bleeding, recurrent infection, finding a normal gallbladder, the need for additional procedures, failure to diagnose a condition, the possible need to convert to an open procedure, and creating a complication requiring transfusion or operation were discussed with the patient. The patient and/or family concurred with the proposed plan, giving informed consent. The site of surgery properly noted/marked. The patient was taken to Operating Room, identified as [name] and the procedure verified as Laparoscopic Cholecystectomy with Intraoperative Cholangiograms. A Time Out was held and the above information confirmed.  Prior to the induction of general anesthesia, antibiotic prophylaxis was administered. General endotracheal anesthesia was then administered and tolerated well. After the induction, the abdomen was prepped in the usual sterile fashion. The patient was positioned in the supine position with the left arm comfortably tucked, along with some reverse Trendelenburg.  Local anesthetic agent was injected into the skin near the umbilicus and an incision made. The midline fascia was incised and the Hasson technique was used to introduce a 10 mm port under  direct vision. It was secured with a figure of eight Vicryl suture placed in the usual fashion. Pneumoperitoneum was then created with CO2 and tolerated well without any adverse changes in the patient's vital signs. Additional trocars were introduced under direct vision. All skin incisions were infiltrated with a local anesthetic agent before making the incision and placing the trocars.   The gallbladder was identified, the fundus grasped and retracted cephalad. Adhesions were lysed bluntly and with the electrocautery where indicated, taking care not to injure any adjacent organs or viscus. The infundibulum was grasped and retracted laterally, exposing the peritoneum overlying the triangle of Calot. This was then divided and exposed in a blunt fashion. The cystic duct was clearly identified and bluntly dissected circumferentially. The junctions of the gallbladder, cystic duct and common bile duct were clearly identified prior to the division of any linear structure. There were significant adhesions to the gallbladder from the omentum and acute on chronic inflammation.  An incision was made in the cystic duct and the cholangiogram catheter introduced. The catheter was secured using an endoclip. The study showed no stones and good visualization of the distal and proximal biliary tree. The catheter was then removed.   The cystic duct was then  ligated with surgical clips a on the patient side and  clipped on the gallbladder side and divided. The cystic artery was identified, dissected free, ligated with clips and divided as well. There was significant inflammation.    The gallbladder was dissected from the liver bed in retrograde fashion with the electrocautery. The gallbladder was removed. The liver bed was irrigated and inspected. Hemostasis was achieved with the electrocautery. Copious irrigation was utilized and was repeatedly aspirated until clear all particulate matter.  Surgicel and a drain was left in  the gallbladder fossa.  Pneumoperitoneum was completely reduced after viewing removal of the trocars under direct vision. The wound was  thoroughly irrigated and the fascia was then closed with a figure of eight suture; the skin was then closed with 4 0 monocryl and a sterile dressing was applied.  Instrument, sponge, and needle counts were correct at closure and at the conclusion of the case.   Findings: Cholecystitis with Cholelithiasis  Estimated Blood Loss: less than 100 mL         Drains: 19 round         Total IV Fluids:         Specimens: Gallbladder           Complications: None; patient tolerated the procedure well.         Disposition: PACU - hemodynamically stable.         Condition: stable

## 2011-08-16 NOTE — ED Notes (Signed)
Patient is resting comfortably.Family at bedside. Waiting for Surg. Consult. Additional call made to [facility] to notify them of pt.

## 2011-08-16 NOTE — Anesthesia Postprocedure Evaluation (Signed)
  Anesthesia Post-op Note  Patient: [name]  Procedure(s) Performed: Procedure(s) (LRB): LAPAROSCOPIC CHOLECYSTECTOMY WITH INTRAOPERATIVE CHOLANGIOGRAM (N/A)  Patient Location: PACU  Anesthesia Type: General  Level of Consciousness: awake, alert  and oriented  Airway and Oxygen Therapy: Patient Spontanous Breathing and Patient connected to nasal cannula oxygen  Post-op Pain: mild  Post-op Assessment: Post-op Vital signs reviewed, Patient's Cardiovascular Status Stable, Respiratory Function Stable, Patent Airway, No signs of Nausea or vomiting and Pain level controlled  Post-op Vital Signs: Reviewed and stable  Complications: No apparent anesthesia complications

## 2011-08-16 NOTE — Plan of Care (Signed)
Problem: Diagnosis - Type of Surgery Goal: General Surgical Patient Education (See Patient Education module for education specifics)  Outcome: Progressing Pre-op teaching given. Patient instructed in deep breathing and coughing, need for ambulation, and pain management.  Verbalized understanding.

## 2011-08-16 NOTE — Preoperative (Signed)
Beta Blockers   Reason not to administer Beta Blockers:Not Applicable 

## 2011-08-16 NOTE — H&P (Signed)
Pt seen for acute cholecystitis.  See consult note. Laparoscopic cholecystectomy and cholangiogram.

## 2011-08-16 NOTE — Consult Note (Signed)
Reason for Consult:Abdominal pain and gallstones Referring Physician: Deloe  Brandon Dardis is an 51 y.o. female.  HPI: Pt presents from urgent care with 1 day history of epigastric pain and nausea.  Pain is sharp 10/10 located in RUQ and epigastrium without radiation.  Pain made worse with eating.  Has had episodes before.  Past Medical History  Diagnosis Date  . No pertinent past medical history     Past Surgical History  Procedure Date  . No past surgeries     History reviewed. No pertinent family history.  Social History:  reports that she has never smoked. She has never used smokeless tobacco. She reports that she does not drink alcohol or use illicit drugs.  Allergies: No Known Allergies  Medications:  I have reviewed the patient's current medications. Prior to Admission:  Prescriptions prior to admission  Medication Sig Dispense Refill  . acetaminophen (TYLENOL) 500 MG chewable tablet Chew 500 mg by mouth every 4 (four) hours.      Marland Kitchen HYDROcodone-acetaminophen (VICODIN) 5-500 MG per tablet Take 1 tablet by mouth every 8 (eight) hours as needed. For pain      . lansoprazole (PREVACID) 30 MG capsule Take 30 mg by mouth daily.      . promethazine (PHENERGAN) 25 MG tablet Take 1 tablet (25 mg total) by mouth every 8 (eight) hours as needed for nausea.  20 tablet  0   Scheduled:    . sodium chloride   Intravenous Once  . ampicillin-sulbactam (UNASYN) IV  3 g Intravenous Once  . ampicillin-sulbactam (UNASYN) IV  3 g Intravenous Q6H  . enoxaparin  40 mg Subcutaneous Q24H  .  HYDROmorphone (DILAUDID) injection  1 mg Intravenous Once  . ondansetron  4 mg Intravenous Once   Continuous:    . dextrose 5 % and 0.45 % NaCl with KCl 10 mEq/L     PRN:  Results for orders placed during the hospital encounter of 08/16/11 (from the past 48 hour(s))  CBC     Status: Abnormal   Collection Time   08/16/11  9:20 AM      Component Value Range Comment   WBC 5.3  4.0 - 10.5 (K/uL)    RBC 4.50  3.87 - 5.11 (MIL/uL)    Hemoglobin 13.0  12.0 - 15.0 (g/dL)    HCT 96.0  45.4 - 09.8 (%)    MCV 86.9  78.0 - 100.0 (fL)    MCH 28.9  26.0 - 34.0 (pg)    MCHC 33.2  30.0 - 36.0 (g/dL)    RDW 11.9  14.7 - 82.9 (%)    Platelets 408 (*) 150 - 400 (K/uL)   DIFFERENTIAL     Status: Normal   Collection Time   08/16/11  9:20 AM      Component Value Range Comment   Neutrophils Relative 45  43 - 77 (%)    Neutro Abs 2.4  1.7 - 7.7 (K/uL)    Lymphocytes Relative 45  12 - 46 (%)    Lymphs Abs 2.4  0.7 - 4.0 (K/uL)    Monocytes Relative 8  3 - 12 (%)    Monocytes Absolute 0.4  0.1 - 1.0 (K/uL)    Eosinophils Relative 2  0 - 5 (%)    Eosinophils Absolute 0.1  0.0 - 0.7 (K/uL)    Basophils Relative 0  0 - 1 (%)    Basophils Absolute 0.0  0.0 - 0.1 (K/uL)   COMPREHENSIVE METABOLIC PANEL  Status: Abnormal   Collection Time   08/16/11  9:20 AM      Component Value Range Comment   Sodium 135  135 - 145 (mEq/L)    Potassium 3.8  3.5 - 5.1 (mEq/L)    Chloride 99  96 - 112 (mEq/L)    CO2 27  19 - 32 (mEq/L)    Glucose, Bld 81  70 - 99 (mg/dL)    BUN 6  6 - 23 (mg/dL)    Creatinine, Ser 1.61  0.50 - 1.10 (mg/dL)    Calcium 9.6  8.4 - 10.5 (mg/dL)    Total Protein 8.7 (*) 6.0 - 8.3 (g/dL)    Albumin 3.6  3.5 - 5.2 (g/dL)    AST 12  0 - 37 (U/L)    ALT 8  0 - 35 (U/L)    Alkaline Phosphatase 59  39 - 117 (U/L)    Total Bilirubin 0.2 (*) 0.3 - 1.2 (mg/dL)    GFR calc non Af Amer >90  >90 (mL/min)    GFR calc Af Amer >90  >90 (mL/min)   LIPASE, BLOOD     Status: Normal   Collection Time   08/16/11  9:20 AM      Component Value Range Comment   Lipase 14  11 - 59 (U/L)     US Abdomen Complete  08/16/2011  *RADIOLOGY REPORT*  Clinical Data:  Epigastric abdominal pain.  COMPLETE ABDOMINAL ULTRASOUND  Comparison:  None.  Findings:  Gallbladder:  Numerous gallstones.  These measure up to 3.2 cm.  A 2.3 cm stone is identified within the gallbladder neck.  The gallbladder wall is mildly  thickened, at 5 mm.  No pericholecystic fluid. Sonographic Murphy's sign was not elicited.  Common bile duct: Normal, 3 mm.  Liver: Normal in echogenicity, without focal lesion.  IVC: Negative  Pancreas:  Negative  Spleen:  Normal in size and echogenicity.  Right Kidney:  11.1 cm. No hydronephrosis.  Left Kidney:  12.4 cm. No hydronephrosis.  Abdominal aorta:  Nonaneurysmal without ascites.  IMPRESSION:  1.  Gallstones, including a gallbladder neck stone.  Wall thickening, without specific evidence of acute cholecystitis. This study was made a "call report". 2.  No biliary ductal dilatation.  Original Report Authenticated By: Consuello Bossier, M.D.    Review of Systems  Constitutional: Negative for fever and chills.  HENT: Negative for hearing loss.   Eyes: Negative.   Respiratory: Negative for cough and hemoptysis.   Cardiovascular: Negative for chest pain and palpitations.  Gastrointestinal: Positive for nausea and abdominal pain. Negative for vomiting.  Genitourinary: Negative.   Musculoskeletal: Negative.   Skin: Negative for rash.  Neurological: Positive for headaches.  Endo/Heme/Allergies: Negative.   Psychiatric/Behavioral: Negative.    Blood pressure 134/91, pulse 78, temperature 98 F (36.7 C), temperature source Oral, resp. rate 18, last menstrual period 08/01/2011, SpO2 100.00%. Physical Exam  Constitutional: She appears well-developed and well-nourished.  HENT:  Head: Normocephalic and atraumatic.  Eyes: EOM are normal. Pupils are equal, round, and reactive to light.  Neck: Normal range of motion. Neck supple.  GI: Soft. There is tenderness. There is rebound. There is no guarding.    Skin: Skin is warm, dry and intact.  Psychiatric: She has a normal mood and affect. Her speech is normal and behavior is normal.    Assessment/Plan: Acute cholecystitis Admit IVF AND ABX Lap cholecystectomy with cholangiogram.The procedure has been discussed with the patient. Operative and  non operative treatments have  been discussed. Risks of surgery include bleeding, infection,  Common bile duct injury,  Injury to the stomach,liver, colon,small intestine, abdominal wall,  Diaphragm,  Major blood vessels,  And the need for an open procedure.  Other risks include worsening of medical problems, death,  DVT and pulmonary embolism, and cardiovascular events.   Medical options have also been discussed. The patient has been informed of long term expectations of surgery and non surgical options,  The patient agrees to proceed.    Darrill Vreeland A. 08/16/2011, 4:49 PM

## 2011-08-16 NOTE — Interval H&P Note (Signed)
History and Physical Interval Note:  08/16/2011 4:53 PM  [name]  has presented today for surgery, with the diagnosis of acute cholecystitis  The various methods of treatment have been discussed with the patient and family. After consideration of risks, benefits and other options for treatment, the patient has consented to  Procedure(s) (LRB): LAPAROSCOPIC CHOLECYSTECTOMY WITH INTRAOPERATIVE CHOLANGIOGRAM (N/A) as a surgical intervention .  The patients' history has been reviewed, patient examined, no change in status, stable for surgery.  I have reviewed the patients' chart and labs.  Questions were answered to the patient's satisfaction.     [name]

## 2011-08-16 NOTE — ED Provider Notes (Signed)
Medical screening examination/treatment/procedure(s) were performed by non-physician practitioner and as supervising physician I was immediately available for consultation/collaboration.  Japheth Diekman, MD 08/16/11 1506 

## 2011-08-16 NOTE — Consult Note (Signed)
Reason for Consult:Abdominal pain and gallstones Referring Physician: Deloe  [name] is an 51 y.o. female.  HPI: Pt presents from urgent care with 1 day history of epigastric pain and nausea.  Pain is sharp 10/10 located in RUQ and epigastrium without radiation.  Pain made worse with eating.  Has had episodes before.  Past Medical History  Diagnosis Date  . No pertinent past medical history     Past Surgical History  Procedure Date  . No past surgeries     History reviewed. No pertinent family history.  Social History:  reports that she has never smoked. She has never used smokeless tobacco. She reports that she does not drink alcohol or use illicit drugs.  Allergies: No Known Allergies  Medications:  I have reviewed the patient's current medications. Prior to Admission:  Prescriptions prior to admission  Medication Sig Dispense Refill  . acetaminophen (TYLENOL) 500 MG chewable tablet Chew 500 mg by mouth every 4 (four) hours.      Marland Kitchen HYDROcodone-acetaminophen (VICODIN) 5-500 MG per tablet Take 1 tablet by mouth every 8 (eight) hours as needed. For pain      . lansoprazole (PREVACID) 30 MG capsule Take 30 mg by mouth daily.      . promethazine (PHENERGAN) 25 MG tablet Take 1 tablet (25 mg total) by mouth every 8 (eight) hours as needed for nausea.  20 tablet  0   Scheduled:    . sodium chloride   Intravenous Once  . ampicillin-sulbactam (UNASYN) IV  3 g Intravenous Once  . ampicillin-sulbactam (UNASYN) IV  3 g Intravenous Q6H  . enoxaparin  40 mg Subcutaneous Q24H  .  HYDROmorphone (DILAUDID) injection  1 mg Intravenous Once  . ondansetron  4 mg Intravenous Once   Continuous:    . dextrose 5 % and 0.45 % NaCl with KCl 10 mEq/L     PRN:  Results for orders placed during the hospital encounter of 08/16/11 (from the past 48 hour(s))  CBC     Status: Abnormal   Collection Time   08/16/11  9:20 AM      Component Value Range Comment   WBC 5.3  4.0 - 10.5 (K/uL)    RBC 4.50  3.87 - 5.11 (MIL/uL)    Hemoglobin 13.0  12.0 - 15.0 (g/dL)    HCT 96.0  45.4 - 09.8 (%)    MCV 86.9  78.0 - 100.0 (fL)    MCH 28.9  26.0 - 34.0 (pg)    MCHC 33.2  30.0 - 36.0 (g/dL)    RDW 11.9  14.7 - 82.9 (%)    Platelets 408 (*) 150 - 400 (K/uL)   DIFFERENTIAL     Status: Normal   Collection Time   08/16/11  9:20 AM      Component Value Range Comment   Neutrophils Relative 45  43 - 77 (%)    Neutro Abs 2.4  1.7 - 7.7 (K/uL)    Lymphocytes Relative 45  12 - 46 (%)    Lymphs Abs 2.4  0.7 - 4.0 (K/uL)    Monocytes Relative 8  3 - 12 (%)    Monocytes Absolute 0.4  0.1 - 1.0 (K/uL)    Eosinophils Relative 2  0 - 5 (%)    Eosinophils Absolute 0.1  0.0 - 0.7 (K/uL)    Basophils Relative 0  0 - 1 (%)    Basophils Absolute 0.0  0.0 - 0.1 (K/uL)   COMPREHENSIVE METABOLIC PANEL  Status: Abnormal   Collection Time   08/16/11  9:20 AM      Component Value Range Comment   Sodium 135  135 - 145 (mEq/L)    Potassium 3.8  3.5 - 5.1 (mEq/L)    Chloride 99  96 - 112 (mEq/L)    CO2 27  19 - 32 (mEq/L)    Glucose, Bld 81  70 - 99 (mg/dL)    BUN 6  6 - 23 (mg/dL)    Creatinine, Ser 4.09  0.50 - 1.10 (mg/dL)    Calcium 9.6  8.4 - 10.5 (mg/dL)    Total Protein 8.7 (*) 6.0 - 8.3 (g/dL)    Albumin 3.6  3.5 - 5.2 (g/dL)    AST 12  0 - 37 (U/L)    ALT 8  0 - 35 (U/L)    Alkaline Phosphatase 59  39 - 117 (U/L)    Total Bilirubin 0.2 (*) 0.3 - 1.2 (mg/dL)    GFR calc non Af Amer >90  >90 (mL/min)    GFR calc Af Amer >90  >90 (mL/min)   LIPASE, BLOOD     Status: Normal   Collection Time   08/16/11  9:20 AM      Component Value Range Comment   Lipase 14  11 - 59 (U/L)     US Abdomen Complete  08/16/2011  *RADIOLOGY REPORT*  Clinical Data:  Epigastric abdominal pain.  COMPLETE ABDOMINAL ULTRASOUND  Comparison:  None.  Findings:  Gallbladder:  Numerous gallstones.  These measure up to 3.2 cm.  A 2.3 cm stone is identified within the gallbladder neck.  The gallbladder wall is mildly  thickened, at 5 mm.  No pericholecystic fluid. Sonographic Murphy's sign was not elicited.  Common bile duct: Normal, 3 mm.  Liver: Normal in echogenicity, without focal lesion.  IVC: Negative  Pancreas:  Negative  Spleen:  Normal in size and echogenicity.  Right Kidney:  11.1 cm. No hydronephrosis.  Left Kidney:  12.4 cm. No hydronephrosis.  Abdominal aorta:  Nonaneurysmal without ascites.  IMPRESSION:  1.  Gallstones, including a gallbladder neck stone.  Wall thickening, without specific evidence of acute cholecystitis. This study was made a "call report". 2.  No biliary ductal dilatation.  Original Report Authenticated By: Consuello Bossier, M.D.    Review of Systems  Constitutional: Negative for fever and chills.  HENT: Negative for hearing loss.   Eyes: Negative.   Respiratory: Negative for cough and hemoptysis.   Cardiovascular: Negative for chest pain and palpitations.  Gastrointestinal: Positive for nausea and abdominal pain. Negative for vomiting.  Genitourinary: Negative.   Musculoskeletal: Negative.   Skin: Negative for rash.  Neurological: Positive for headaches.  Endo/Heme/Allergies: Negative.   Psychiatric/Behavioral: Negative.    Blood pressure 134/91, pulse 78, temperature 98 F (36.7 C), temperature source Oral, resp. rate 18, last menstrual period 08/01/2011, SpO2 100.00%. Physical Exam  Constitutional: She appears well-developed and well-nourished.  HENT:  Head: Normocephalic and atraumatic.  Eyes: EOM are normal. Pupils are equal, round, and reactive to light.  Neck: Normal range of motion. Neck supple.  GI: Soft. There is tenderness. There is rebound. There is no guarding.    Skin: Skin is warm, dry and intact.  Psychiatric: She has a normal mood and affect. Her speech is normal and behavior is normal.    Assessment/Plan: Acute cholecystitis Admit IVF AND ABX Lap cholecystectomy with cholangiogram.The procedure has been discussed with the patient. Operative and  non operative treatments have  been discussed. Risks of surgery include bleeding, infection,  Common bile duct injury,  Injury to the stomach,liver, colon,small intestine, abdominal wall,  Diaphragm,  Major blood vessels,  And the need for an open procedure.  Other risks include worsening of medical problems, death,  DVT and pulmonary embolism, and cardiovascular events.   Medical options have also been discussed. The patient has been informed of long term expectations of surgery and non surgical options,  The patient agrees to proceed.    Sherea Liptak A. 08/16/2011, 4:51 PM

## 2011-08-16 NOTE — Telephone Encounter (Signed)
Received call report from [facility], scan did show gallstones, with one in the gallbladder neck.  This was discussed with Dr. Cleta Alberts, who spoke with the patients family member.  Since patient is stating she is in severe pain, MD advised that she should be taken to [facility].  We then called the ER to notify them, and spoke with Dr. Luisa Hart who is on call for [facility].  Note to Dr. Georgiana Shore, Lorain Childes.

## 2011-08-17 ENCOUNTER — Other Ambulatory Visit (INDEPENDENT_AMBULATORY_CARE_PROVIDER_SITE_OTHER): Payer: Self-pay | Admitting: Surgery

## 2011-08-17 LAB — CBC
MCH: 28.3 pg (ref 26.0–34.0)
MCHC: 32.7 g/dL (ref 30.0–36.0)
Platelets: 439 10*3/uL — ABNORMAL HIGH (ref 150–400)

## 2011-08-17 LAB — COMPREHENSIVE METABOLIC PANEL
ALT: 32 U/L (ref 0–35)
AST: 63 U/L — ABNORMAL HIGH (ref 0–37)
CO2: 23 mEq/L (ref 19–32)
Calcium: 8.9 mg/dL (ref 8.4–10.5)
Chloride: 100 mEq/L (ref 96–112)
Creatinine, Ser: 0.59 mg/dL (ref 0.50–1.10)
GFR calc Af Amer: >90 mL/min (ref >90)
GFR calc non Af Amer: >90 mL/min (ref >90)
Glucose, Bld: 149 mg/dL — ABNORMAL HIGH (ref 70–99)
Sodium: 132 mEq/L — ABNORMAL LOW (ref 135–145)
Total Bilirubin: 0.2 mg/dL — ABNORMAL LOW (ref 0.3–1.2)

## 2011-08-17 NOTE — Discharge Summary (Signed)
Physician Discharge Summary  Patient ID: [name] MRN: [id number] DOB/AGE: 25-Nov-1960 51 y.o.  Admit date: 08/16/2011 Discharge date: 08/17/2011  Admission Diagnoses:  cholelithiasis  Discharge Diagnoses:  Same, chronic cholecystitis  Active Problems:  * No active hospital problems. *    Surgery:  Lap cholecystectomy  Discharged Condition: improved  Hospital Course:  Admitted postop.  Taking po and walking around   Consults: none  Significant Diagnostic Studies: none    Discharge Exam: Blood pressure 129/78, pulse 86, temperature 98.3 F (36.8 C), temperature source Oral, resp. rate 18, height 5\' 6"  (1.676 m), weight 260 lb (117.935 kg), last menstrual period 08/01/2011, SpO2 99.00%. Patient dressed in Moslem garments and seen with female attendants but I did not examine her wounds at this time  Disposition:   Discharge Orders    Future Orders Please Complete By Expires   Diet - low sodium heart healthy      Increase activity slowly      Discharge instructions      Comments:   May shower upon discharge Call office [phone] to arrange work papers about returning to work     Medication List  As of 08/17/2011  9:04 AM   TAKE these medications         acetaminophen 500 MG chewable tablet   Commonly known as: TYLENOL   Chew 500 mg by mouth every 4 (four) hours.      HYDROcodone-acetaminophen 5-500 MG per tablet   Commonly known as: VICODIN   Take 1 tablet by mouth every 8 (eight) hours as needed. For pain      lansoprazole 30 MG capsule   Commonly known as: PREVACID   Take 30 mg by mouth daily.      promethazine 25 MG tablet   Commonly known as: PHENERGAN   Take 1 tablet (25 mg total) by mouth every 8 (eight) hours as needed for nausea.           Follow-up Information    Follow up with CORNETT,THOMAS A., MD in 4 weeks.   Contact information:   [employer] [facility] [address], [address] [place] [address] [phone]          Signed: Valarie Merino 08/17/2011, 9:04 AM

## 2011-08-17 NOTE — Discharge Instructions (Signed)

## 2011-08-17 NOTE — Progress Notes (Signed)
Pt discharged to home provided discharge instructions and prescriptions along with handouts. Pt verbalized understanding of discharge information. Pt stable. Pt transported by tech IV removed and documented. Hannie Shoe Howell, RN    

## 2011-08-19 ENCOUNTER — Telehealth (INDEPENDENT_AMBULATORY_CARE_PROVIDER_SITE_OTHER): Payer: Self-pay | Admitting: Surgery

## 2011-08-19 ENCOUNTER — Encounter (HOSPITAL_COMMUNITY): Payer: Self-pay | Admitting: Surgery

## 2011-08-19 ENCOUNTER — Encounter (INDEPENDENT_AMBULATORY_CARE_PROVIDER_SITE_OTHER): Payer: Self-pay

## 2011-08-19 LAB — MRSA CULTURE

## 2011-08-19 LAB — SURGICAL PCR SCREEN: Staphylococcus aureus: INVALID — AB

## 2011-09-02 ENCOUNTER — Encounter (INDEPENDENT_AMBULATORY_CARE_PROVIDER_SITE_OTHER): Payer: Self-pay | Admitting: Surgery

## 2011-09-02 ENCOUNTER — Ambulatory Visit (INDEPENDENT_AMBULATORY_CARE_PROVIDER_SITE_OTHER): Payer: BC Managed Care – PPO | Admitting: Surgery

## 2011-09-02 VITALS — BP 124/72 | HR 68 | Temp 97.5°F | Resp 16 | Ht 66.0 in | Wt 280.5 lb

## 2011-09-02 DIAGNOSIS — Z9889 Other specified postprocedural states: Secondary | ICD-10-CM

## 2011-09-02 NOTE — Patient Instructions (Signed)
Return to work 09/17/2011.

## 2011-09-02 NOTE — Progress Notes (Signed)
she is here for a postop visit following laparoscopic cholecystectomy.  Diet is being tolerated, bowels are moving.  No problems with incisions.    ABD:  Soft, incisions clean/dry/intact and solid.  Assessment:  Doing well postop.  Plan:  Lowfat diet recommended.  Activities as tolerated.  Return visit prn.

## 2012-12-25 ENCOUNTER — Ambulatory Visit: Payer: BC Managed Care – PPO

## 2012-12-25 ENCOUNTER — Ambulatory Visit (INDEPENDENT_AMBULATORY_CARE_PROVIDER_SITE_OTHER): Payer: BC Managed Care – PPO | Admitting: Emergency Medicine

## 2012-12-25 VITALS — BP 120/88 | HR 86 | Temp 98.5°F | Resp 16 | Ht 65.5 in | Wt 292.0 lb

## 2012-12-25 DIAGNOSIS — M25552 Pain in left hip: Secondary | ICD-10-CM

## 2012-12-25 DIAGNOSIS — M549 Dorsalgia, unspecified: Secondary | ICD-10-CM

## 2012-12-25 DIAGNOSIS — M25559 Pain in unspecified hip: Secondary | ICD-10-CM

## 2012-12-25 MED ORDER — CYCLOBENZAPRINE HCL 10 MG PO TABS: ORAL_TABLET | ORAL | Status: DC

## 2012-12-25 MED ORDER — MELOXICAM 15 MG PO TABS: 15.0000 mg | ORAL_TABLET | Freq: Every day | ORAL | Status: DC

## 2012-12-25 NOTE — Patient Instructions (Addendum)
Back Pain, Adult  Low back pain is very common. About 1 in 5 people have back pain. The cause of low back pain is rarely dangerous. The pain often gets better over time. About half of people with a sudden onset of back pain feel better in just 2 weeks. About 8 in 10 people feel better by 6 weeks.   CAUSES  Some common causes of back pain include:  · Strain of the muscles or ligaments supporting the spine.  · Wear and tear (degeneration) of the spinal discs.  · Arthritis.  · Direct injury to the back.  DIAGNOSIS  Most of the time, the direct cause of low back pain is not known. However, back pain can be treated effectively even when the exact cause of the pain is unknown. Answering your caregiver's questions about your overall health and symptoms is one of the most accurate ways to make sure the cause of your pain is not dangerous. If your caregiver needs more information, he or she may order lab work or imaging tests (X-rays or MRIs). However, even if imaging tests show changes in your back, this usually does not require surgery.  HOME CARE INSTRUCTIONS  For many people, back pain returns. Since low back pain is rarely dangerous, it is often a condition that people can learn to manage on their own.   · Remain active. It is stressful on the back to sit or stand in one place. Do not sit, drive, or stand in one place for more than 30 minutes at a time. Take short walks on level surfaces as soon as pain allows. Try to increase the length of time you walk each day.  · Do not stay in bed. Resting more than 1 or 2 days can delay your recovery.  · Do not avoid exercise or work. Your body is made to move. It is not dangerous to be active, even though your back may hurt. Your back will likely heal faster if you return to being active before your pain is gone.  · Pay attention to your body when you  bend and lift. Many people have less discomfort when lifting if they bend their knees, keep the load close to their bodies, and  avoid twisting. Often, the most comfortable positions are those that put less stress on your recovering back.  · Find a comfortable position to sleep. Use a firm mattress and lie on your side with your knees slightly bent. If you lie on your back, put a pillow under your knees.  · Only take over-the-counter or prescription medicines as directed by your caregiver. Over-the-counter medicines to reduce pain and inflammation are often the most helpful. Your caregiver may prescribe muscle relaxant drugs. These medicines help dull your pain so you can more quickly return to your normal activities and healthy exercise.  · Put ice on the injured area.  · Put ice in a plastic bag.  · Place a towel between your skin and the bag.  · Leave the ice on for 15-20 minutes, 3-4 times a day for the first 2 to 3 days. After that, ice and heat may be alternated to reduce pain and spasms.  · Ask your caregiver about trying back exercises and gentle massage. This may be of some benefit.  · Avoid feeling anxious or stressed. Stress increases muscle tension and can worsen back pain. It is important to recognize when you are anxious or stressed and learn ways to manage it. Exercise is a great option.  SEEK MEDICAL CARE IF:  · You have pain that is not relieved with rest or   medicine.  · You have pain that does not improve in 1 week.  · You have new symptoms.  · You are generally not feeling well.  SEEK IMMEDIATE MEDICAL CARE IF:   · You have pain that radiates from your back into your legs.  · You develop new bowel or bladder control problems.  · You have unusual weakness or numbness in your arms or legs.  · You develop nausea or vomiting.  · You develop abdominal pain.  · You feel faint.  Document Released: 05/06/2005 Document Revised: 11/05/2011 Document Reviewed: 09/24/2010  [employer]® Patient Information ©2014 [employer].

## 2012-12-25 NOTE — Progress Notes (Signed)
  Subjective:    Patient ID: [name], female    DOB: 1960-12-28, 52 y.o.   MRN: [id number]  HPI Patient presents with pain and swelling in left leg at the hip area. Swelling has been in ankle as well. She noticed this 2 days ago. She said it hurts the most when walking.  She has a little of pain in her back.    Review of Systems     Objective:   Physical Exam there is tenderness over the lumbar spine. There is tenderness in the left sciatic area. There is good range of motion of the left hip. Straight leg raising is positive on the left at about 80. Motor strength is 5 out of 5. She has pain with bending or twisting of her back.  [facility] reading (PRIMARY) by  Dr Cleta Alberts is an IUD in place. There are clips present in the right upper abdomen. There is a possible L5-S1 spondylolisthesis. Grade 1 the hip does show some mild narrowing in the joint space but no other abnormalities        Assessment & Plan:     We'll treat with Mobic 15 one a day along with Flexeril at night she was placed out of work for 3-4 days and then light duty. Recheck if she continues to have trouble.

## 2013-03-31 ENCOUNTER — Ambulatory Visit (INDEPENDENT_AMBULATORY_CARE_PROVIDER_SITE_OTHER): Payer: BC Managed Care – PPO | Admitting: Family Medicine

## 2013-03-31 VITALS — BP 110/70 | HR 85 | Temp 98.3°F | Resp 16 | Ht 65.75 in | Wt 293.0 lb

## 2013-03-31 DIAGNOSIS — R21 Rash and other nonspecific skin eruption: Secondary | ICD-10-CM

## 2013-03-31 MED ORDER — PREDNISONE 20 MG PO TABS: 40.0000 mg | ORAL_TABLET | Freq: Every day | ORAL | Status: DC

## 2013-03-31 MED ORDER — HYDROXYZINE HCL 25 MG PO TABS: 25.0000 mg | ORAL_TABLET | Freq: Three times a day (TID) | ORAL | Status: DC | PRN

## 2013-03-31 NOTE — Progress Notes (Signed)
  Subjective:    Patient ID: [name], female    DOB: April 03, 1961, 52 y.o.   MRN: [id number]  HPI Patient presents with rash. Started 10 days ago and spread. Very itchy. No other symptoms of viral illness. Rash feels hot but she has no fever. No new medicines or foods. Has tried vaseline on the rash that helps just a little. Also tried coconut oil which is not helpful. Never had similar rash. Rash is on arms, chest, and back. No contacts with similar rash. No new soap, detergents, skin products. No recent travel. Recently moved in to a new apt but this started beforehand.   Review of Systems  All other systems reviewed and are negative.      Objective:   Physical Exam  Constitutional: She is oriented to person, place, and time. She appears well-developed and well-nourished.  HENT:  Head: Normocephalic and atraumatic.  Eyes: Conjunctivae are normal. No scleral icterus.  Neck: Normal range of motion.  Pulmonary/Chest: Effort normal.  Musculoskeletal: Normal range of motion.  Neurological: She is alert and oriented to person, place, and time.  Skin: Diffuse erythematous maculopapular rash on bilateral arms with coalesence into plaques and  and some areas of hive - like lesions. Not isolated to hair follicles. No vesicles or pustules. Evidence of excoriation. Located on BLE, anterior chest, and superior back    Assessment & Plan:  #1. Rash - favor atopic/allergic - Possibly unwashed new sheets? - Not follicular, doubt folliculitis - No red flag symptoms - Prednisone x 5 days  - Hydroxyzine prn - Return precautions including worsening rash, fever, systemic symptoms

## 2013-03-31 NOTE — Patient Instructions (Addendum)
Thank you for coming in today  Take prednisone 2 tablets daily for 5days Take hydroxyzine as needed for itching at night, will make you sleepy Try to avoid scratching Use vaseline as needed Avoid any soaps, detergents, lotions that smell good Return if rash worsens or you develop fever, sickness  Rash A rash is a change in the color or texture of your skin. There are many different types of rashes. You may have other problems that accompany your rash. CAUSES   Infections.  Allergic reactions. This can include allergies to pets or foods.  Certain medicines.  Exposure to certain chemicals, soaps, or cosmetics.  Heat.  Exposure to poisonous plants.  Tumors, both cancerous and noncancerous. SYMPTOMS   Redness.  Scaly skin.  Itchy skin.  Dry or cracked skin.  Bumps.  Blisters.  Pain. DIAGNOSIS  Your caregiver may do a physical exam to determine what type of rash you have. A skin sample (biopsy) may be taken and examined under a microscope. TREATMENT  Treatment depends on the type of rash you have. Your caregiver may prescribe certain medicines. For serious conditions, you may need to see a skin doctor (dermatologist). HOME CARE INSTRUCTIONS   Avoid the substance that caused your rash.  Do not scratch your rash. This can cause infection.  You may take cool baths to help stop itching.  Only take over-the-counter or prescription medicines as directed by your caregiver.  Keep all follow-up appointments as directed by your caregiver. SEEK IMMEDIATE MEDICAL CARE IF:  You have increasing pain, swelling, or redness.  You have a fever.  You have new or severe symptoms.  You have body aches, diarrhea, or vomiting.  Your rash is not better after 3 days. MAKE SURE YOU:  Understand these instructions.  Will watch your condition.  Will get help right away if you are not doing well or get worse. Document Released: 04/26/2002 Document Revised: 07/29/2011 Document  Reviewed: 02/18/2011 [employer] Patient Information 2014 [place], [place].

## 2014-01-08 IMAGING — CR DG LUMBAR SPINE 2-3V
2 series · 2 of 2 positions shown · non-contrast
Comparison: None.

CLINICAL DATA: Back pain and left hip pain for 2 days.

LUMBAR SPINE - 2-3 VIEW

[AP]
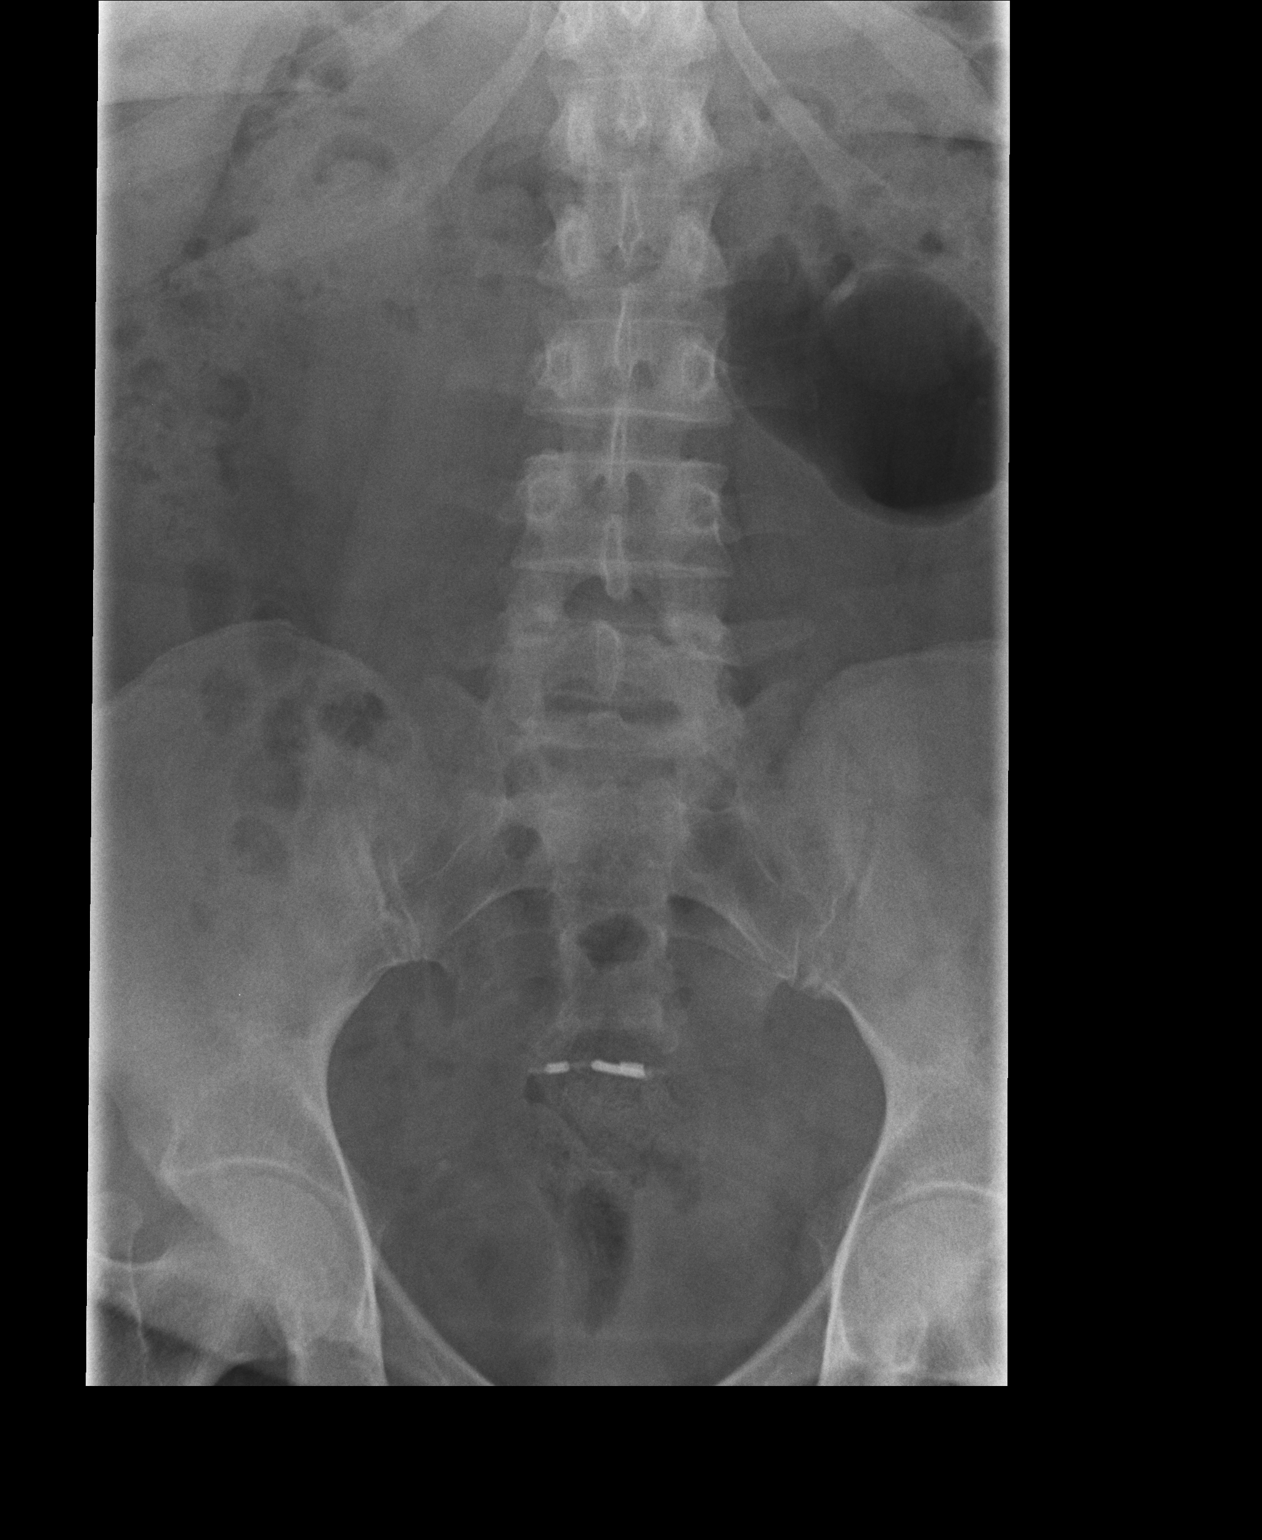

[lateral]
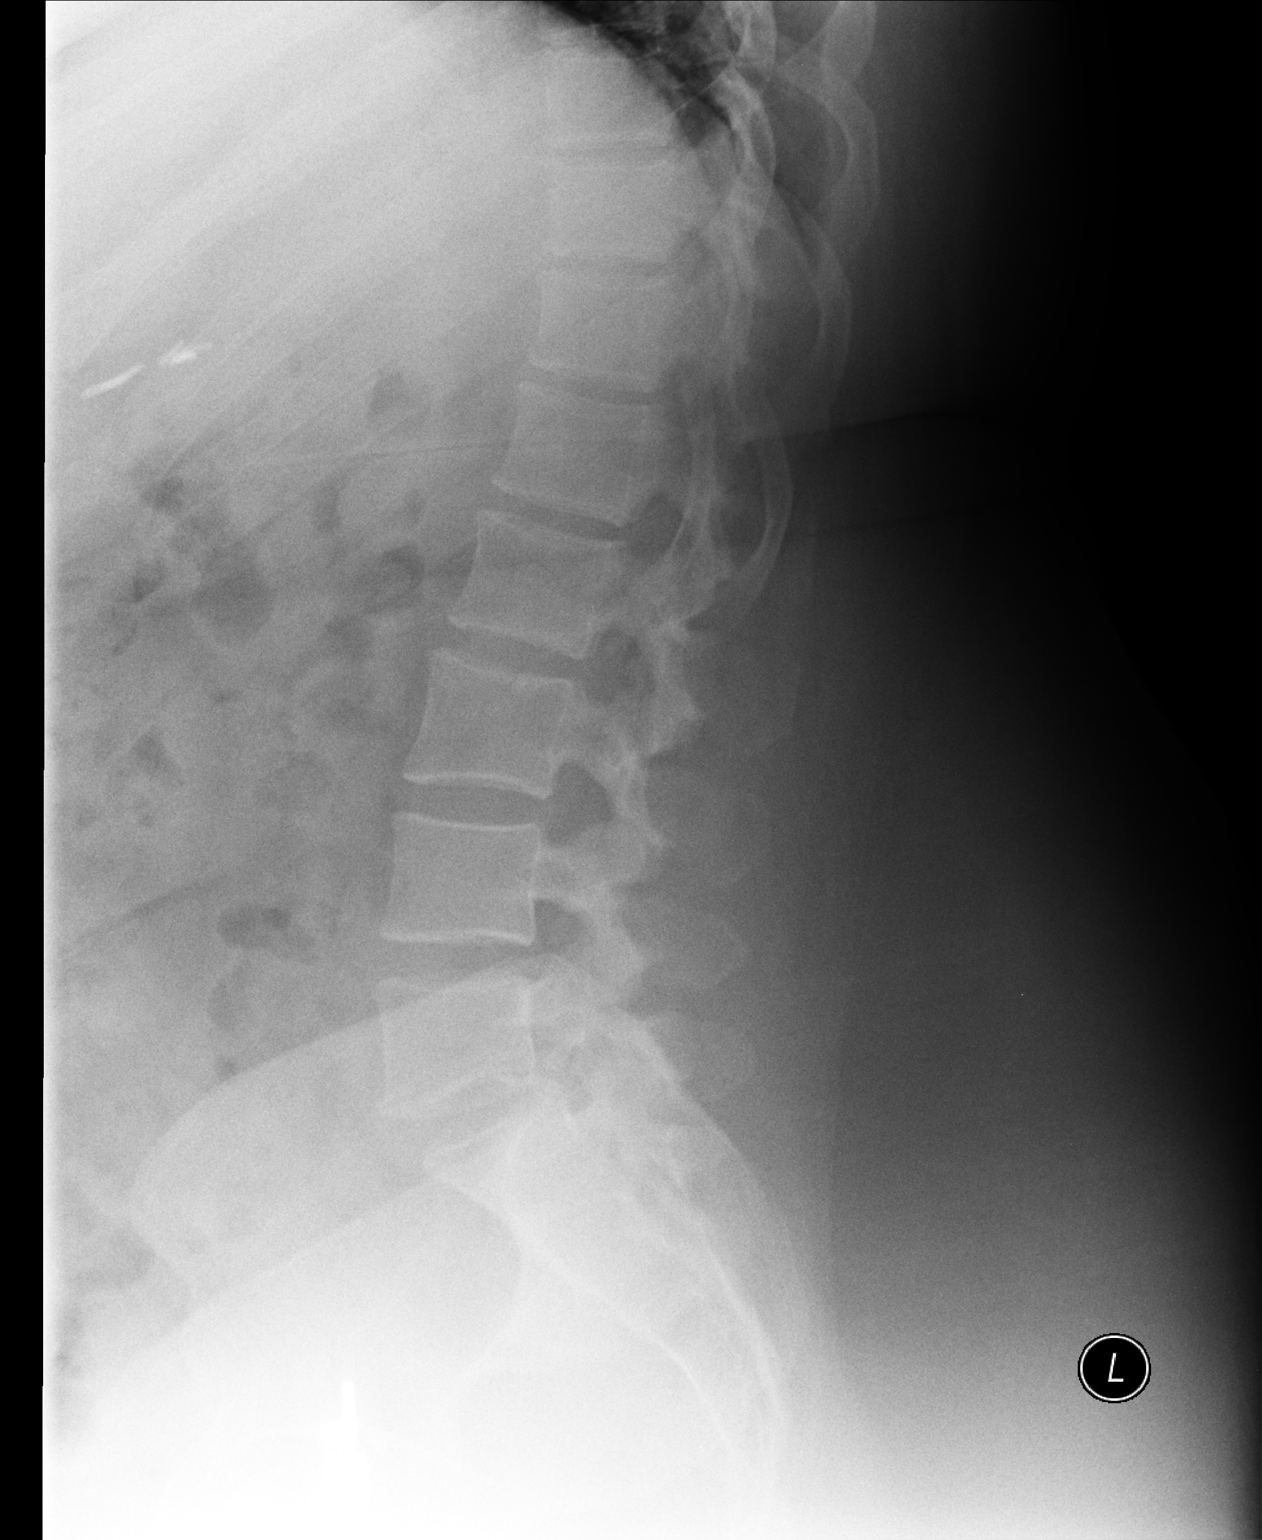

[2 of 2 positions shown; findings below may reference images not displayed]

FINDINGS: There are five lumbar type vertebral bodies.  There is 3
mm of anterolisthesis at L5-S1 associated with mild disc space
loss.  The possibility of underlying L5 pars defects cannot be
excluded on these views.  The alignment is otherwise normal.  The
additional disc spaces are preserved.  Intrauterine device and
probable cholecystectomy clips are noted.
IMPRESSION: Disc space loss and grade 1 anterolisthesis at L5-S1.  Possible
underlying L5 pars defects.

Clinically significant discrepancy from primary report, if
provided: None

## 2015-01-01 ENCOUNTER — Ambulatory Visit (INDEPENDENT_AMBULATORY_CARE_PROVIDER_SITE_OTHER): Payer: BLUE CROSS/BLUE SHIELD | Admitting: Family Medicine

## 2015-01-01 VITALS — BP 126/78 | HR 85 | Temp 99.0°F | Resp 17 | Ht 66.0 in | Wt 297.0 lb

## 2015-01-01 DIAGNOSIS — R197 Diarrhea, unspecified: Secondary | ICD-10-CM

## 2015-01-01 DIAGNOSIS — K5900 Constipation, unspecified: Secondary | ICD-10-CM

## 2015-01-01 DIAGNOSIS — K625 Hemorrhage of anus and rectum: Secondary | ICD-10-CM | POA: Diagnosis not present

## 2015-01-01 DIAGNOSIS — N3090 Cystitis, unspecified without hematuria: Secondary | ICD-10-CM | POA: Diagnosis not present

## 2015-01-01 DIAGNOSIS — K602 Anal fissure, unspecified: Secondary | ICD-10-CM

## 2015-01-01 DIAGNOSIS — R1084 Generalized abdominal pain: Secondary | ICD-10-CM

## 2015-01-01 LAB — POCT CBC
Granulocyte percent: 46.3 %G (ref 37–80)
HCT, POC: 35.9 % — AB (ref 37.7–47.9)
Hemoglobin: 11.3 g/dL — AB (ref 12.2–16.2)
LYMPH, POC: 2.5 (ref 0.6–3.4)
MCH, POC: 25.8 pg — AB (ref 27–31.2)
MCHC: 31.6 g/dL — AB (ref 31.8–35.4)
MCV: 81.7 fL (ref 80–97)
MID (CBC): 0.4 (ref 0–0.9)
MPV: 7 fL (ref 0–99.8)
PLATELET COUNT, POC: 369 10*3/uL (ref 142–424)
POC GRANULOCYTE: 2.5 (ref 2–6.9)
POC LYMPH PERCENT: 45.8 %L (ref 10–50)
POC MID %: 7.9 % (ref 0–12)
RBC: 4.39 M/uL (ref 4.04–5.48)
RDW, POC: 14.5 %
WBC: 5.5 10*3/uL (ref 4.6–10.2)

## 2015-01-01 LAB — POCT UA - MICROSCOPIC ONLY
CASTS, UR, LPF, POC: NEGATIVE
CRYSTALS, UR, HPF, POC: NEGATIVE
Mucus, UA: NEGATIVE
Yeast, UA: NEGATIVE

## 2015-01-01 LAB — POCT URINALYSIS DIPSTICK
Bilirubin, UA: NEGATIVE
Glucose, UA: NEGATIVE
KETONES UA: NEGATIVE
NITRITE UA: NEGATIVE
PH UA: 7
Protein, UA: NEGATIVE
SPEC GRAV UA: 1.02
Urobilinogen, UA: 0.2

## 2015-01-01 LAB — POCT URINE PREGNANCY: Preg Test, Ur: NEGATIVE

## 2015-01-01 MED ORDER — LIDOCAINE (ANORECTAL) 5 % EX GEL: CUTANEOUS | Status: DC

## 2015-01-01 MED ORDER — DILTIAZEM GEL 2 %: CUTANEOUS | Status: DC

## 2015-01-01 MED ORDER — NITROFURANTOIN MONOHYD MACRO 100 MG PO CAPS: 100.0000 mg | ORAL_CAPSULE | Freq: Two times a day (BID) | ORAL | Status: DC

## 2015-01-01 NOTE — Patient Instructions (Signed)
You may have a urinary tract infection based on the test today.  We will send off a urine culture to make sure, but recommend you start an antibiotic twice per day. This was sent to your pharmacy.  Your abdominal pain may be due from both a urinary or bladder infection and constipation. Increase fiber in the diet, water in the diet, Colace as a stool softener over-the-counter, and if no bowel movement by the second day, can take MiraLAX over-the-counter once. See other information on constipation below.  If your abdominal pain does not improve with treatment of the constipation, fevers, or your symptoms are worsening - return here or the emergency room.   The blood you noticed is likely due to an anal fissure. We did see one on your exam today. You can apply the 2 different ointments to this area as discussed, see more information below. If this persists, return to clinic.    Urinary Tract Infection Urinary tract infections (UTIs) can develop anywhere along your urinary tract. Your urinary tract is your body's drainage system for removing wastes and extra water. Your urinary tract includes two kidneys, two ureters, a bladder, and a urethra. Your kidneys are a pair of bean-shaped organs. Each kidney is about the size of your fist. They are located below your ribs, one on each side of your spine. CAUSES Infections are caused by microbes, which are microscopic organisms, including fungi, viruses, and bacteria. These organisms are so small that they can only be seen through a microscope. Bacteria are the microbes that most commonly cause UTIs. SYMPTOMS  Symptoms of UTIs may vary by age and gender of the patient and by the location of the infection. Symptoms in young women typically include a frequent and intense urge to urinate and a painful, burning feeling in the bladder or urethra during urination. Older women and men are more likely to be tired, shaky, and weak and have muscle aches and abdominal pain.  A fever may mean the infection is in your kidneys. Other symptoms of a kidney infection include pain in your back or sides below the ribs, nausea, and vomiting. DIAGNOSIS To diagnose a UTI, your caregiver will ask you about your symptoms. Your caregiver also will ask to provide a urine sample. The urine sample will be tested for bacteria and white blood cells. White blood cells are made by your body to help fight infection. TREATMENT  Typically, UTIs can be treated with medication. Because most UTIs are caused by a bacterial infection, they usually can be treated with the use of antibiotics. The choice of antibiotic and length of treatment depend on your symptoms and the type of bacteria causing your infection. HOME CARE INSTRUCTIONS  If you were prescribed antibiotics, take them exactly as your caregiver instructs you. Finish the medication even if you feel better after you have only taken some of the medication.  Drink enough water and fluids to keep your urine clear or pale yellow.  Avoid caffeine, tea, and carbonated beverages. They tend to irritate your bladder.  Empty your bladder often. Avoid holding urine for long periods of time.  Empty your bladder before and after sexual intercourse.  After a bowel movement, women should cleanse from front to back. Use each tissue only once. SEEK MEDICAL CARE IF:   You have back pain.  You develop a fever.  Your symptoms do not begin to resolve within 3 days. SEEK IMMEDIATE MEDICAL CARE IF:   You have severe back pain or  lower abdominal pain.  You develop chills.  You have nausea or vomiting.  You have continued burning or discomfort with urination. MAKE SURE YOU:   Understand these instructions.  Will watch your condition.  Will get help right away if you are not doing well or get worse. Document Released: 02/13/2005 Document Revised: 11/05/2011 Document Reviewed: 06/14/2011 [facility] Patient Information 2015 [place], [place]. This  information is not intended to replace advice given to you by your health care provider. Make sure you discuss any questions you have with your health care provider.   Constipation Constipation is when a person has fewer than three bowel movements a week, has difficulty having a bowel movement, or has stools that are dry, hard, or larger than normal. As people grow older, constipation is more common. If you try to fix constipation with medicines that make you have a bowel movement (laxatives), the problem may get worse. Long-term laxative use may cause the muscles of the colon to become weak. A low-fiber diet, not taking in enough fluids, and taking certain medicines may make constipation worse.  CAUSES   Certain medicines, such as antidepressants, pain medicine, iron supplements, antacids, and water pills.   Certain diseases, such as diabetes, irritable bowel syndrome (IBS), thyroid disease, or depression.   Not drinking enough water.   Not eating enough fiber-rich foods.   Stress or travel.   Lack of physical activity or exercise.   Ignoring the urge to have a bowel movement.   Using laxatives too much.  SIGNS AND SYMPTOMS   Having fewer than three bowel movements a week.   Straining to have a bowel movement.   Having stools that are hard, dry, or larger than normal.   Feeling full or bloated.   Pain in the lower abdomen.   Not feeling relief after having a bowel movement.  DIAGNOSIS  Your health care provider will take a medical history and perform a physical exam. Further testing may be done for severe constipation. Some tests may include:  A barium enema X-ray to examine your rectum, colon, and, sometimes, your small intestine.   A sigmoidoscopy to examine your lower colon.   A colonoscopy to examine your entire colon. TREATMENT  Treatment will depend on the severity of your constipation and what is causing it. Some dietary treatments include drinking  more fluids and eating more fiber-rich foods. Lifestyle treatments may include regular exercise. If these diet and lifestyle recommendations do not help, your health care provider may recommend taking over-the-counter laxative medicines to help you have bowel movements. Prescription medicines may be prescribed if over-the-counter medicines do not work.  HOME CARE INSTRUCTIONS   Eat foods that have a lot of fiber, such as fruits, vegetables, whole grains, and beans.  Limit foods high in fat and processed sugars, such as french fries, hamburgers, cookies, candies, and soda.   A fiber supplement may be added to your diet if you cannot get enough fiber from foods.   Drink enough fluids to keep your urine clear or pale yellow.   Exercise regularly or as directed by your health care provider.   Go to the restroom when you have the urge to go. Do not hold it.   Only take over-the-counter or prescription medicines as directed by your health care provider. Do not take other medicines for constipation without talking to your health care provider first.  SEEK IMMEDIATE MEDICAL CARE IF:   You have bright red blood in your stool.   Your  constipation lasts for more than 4 days or gets worse.   You have abdominal or rectal pain.   You have thin, pencil-like stools.   You have unexplained weight loss. MAKE SURE YOU:   Understand these instructions.  Will watch your condition.  Will get help right away if you are not doing well or get worse. Document Released: 02/02/2004 Document Revised: 05/11/2013 Document Reviewed: 02/15/2013 [facility] Patient Information 2015 [place], [place]. This information is not intended to replace advice given to you by your health care provider. Make sure you discuss any questions you have with your health care provider.  Anal Fissure, Adult An anal fissure is a small tear or crack in the skin around the anus. Bleeding from a fissure usually stops on its own  within a few minutes. However, bleeding will often reoccur with each bowel movement until the crack heals.  CAUSES   Passing large, hard stools.  Frequent diarrheal stools.  Constipation.  Inflammatory bowel disease (Crohn's disease or ulcerative colitis).  Infections.  Anal sex. SYMPTOMS   Small amounts of blood seen on your stools, on toilet paper, or in the toilet after a bowel movement.  Rectal bleeding.  Painful bowel movements.  Itching or irritation around the anus. DIAGNOSIS Your caregiver will examine the anal area. An anal fissure can usually be seen with careful inspection. A rectal exam may be performed and a short tube (anoscope) may be used to examine the anal canal. TREATMENT   You may be instructed to take fiber supplements. These supplements can soften your stool to help make bowel movements easier.  Sitz baths may be recommended to help heal the tear. Do not use soap in the sitz baths.  A medicated cream or ointment may be prescribed to lessen discomfort. HOME CARE INSTRUCTIONS   Maintain a diet high in fruits, whole grains, and vegetables. Avoid constipating foods like bananas and dairy products.  Take sitz baths as directed by your caregiver.  Drink enough fluids to keep your urine clear or pale yellow.  Only take over-the-counter or prescription medicines for pain, discomfort, or fever as directed by your caregiver. Do not take aspirin as this may increase bleeding.  Do not use ointments containing numbing medications (anesthetics) or hydrocortisone. They could slow healing. SEEK MEDICAL CARE IF:   Your fissure is not completely healed within 3 days.  You have further bleeding.  You have a fever.  You have diarrhea mixed with blood.  You have pain.  Your problem is getting worse rather than better. MAKE SURE YOU:   Understand these instructions.  Will watch your condition.  Will get help right away if you are not doing well or get  worse. Document Released: 05/06/2005 Document Revised: 07/29/2011 Document Reviewed: 10/21/2010 [facility] Patient Information 2015 [place], [place]. This information is not intended to replace advice given to you by your health care provider. Make sure you discuss any questions you have with your health care provider.

## 2015-01-01 NOTE — Progress Notes (Addendum)
Subjective:  This chart was scribed for Meredith Staggers, MD by [facility], medical scribe at [facility].The patient was seen in exam room 10 and the patient's care was started at .   Patient ID: [name], female    DOB: 09-07-1960, 54 y.o.   MRN: [id number] Chief Complaint  Patient presents with  . Abdominal Pain   HPI  HPI Comments: [name] is a 54 y.o. female who presents to [facility] complaining of abdominal pain in the lower quadrants. This past week she has been constipated and has pain with straining. Two weeks ago she had diarrhea then constipated. Noticed some rectal bleeding yesterday. Having pain around the rectum after a BM, she describes this pain as sharp and burning. Taking tylenol for relief. Still has menstrual periods, LNMP was the 19 th of July. No chance of pregnancy.   Review of Systems  Gastrointestinal: Positive for abdominal pain, constipation, anal bleeding and rectal pain.      Objective:  Physical Exam  Constitutional: She is oriented to person, place, and time. She appears well-developed and well-nourished. No distress.  HENT:  Head: Normocephalic and atraumatic.  Eyes: Pupils are equal, round, and reactive to light.  Neck: Normal range of motion.  Cardiovascular: Normal rate and regular rhythm.   Pulmonary/Chest: Effort normal. No respiratory distress.  Abdominal: Soft. She exhibits no distension. There is no tenderness. There is no rebound.  Slight discomfort - suprapubic, minimal ttp left of LS spine, but no apparent CVA tenderness otherwise.    Genitourinary:  Exam by Benny Lennert, PA-C. Anal fissure was noted at 6:00.  Musculoskeletal: Normal range of motion.  Neurological: She is alert and oriented to person, place, and time.  Skin: Skin is warm and dry.  Psychiatric: She has a normal mood and affect. Her behavior is normal.  Nursing note and vitals reviewed.  Results for orders placed or  performed in visit on 01/01/15  POCT CBC  Result Value Ref Range   WBC 5.5 4.6 - 10.2 K/uL   Lymph, poc 2.5 0.6 - 3.4   POC LYMPH PERCENT 45.8 10 - 50 %L   MID (cbc) 0.4 0 - 0.9   POC MID % 7.9 0 - 12 %M   POC Granulocyte 2.5 2 - 6.9   Granulocyte percent 46.3 37 - 80 %G   RBC 4.39 4.04 - 5.48 M/uL   Hemoglobin 11.3 (A) 12.2 - 16.2 g/dL   HCT, POC 40.9 (A) 81.1 - 47.9 %   MCV 81.7 80 - 97 fL   MCH, POC 25.8 (A) 27 - 31.2 pg   MCHC 31.6 (A) 31.8 - 35.4 g/dL   RDW, POC 91.4 %   Platelet Count, POC 369 142 - 424 K/uL   MPV 7.0 0 - 99.8 fL  POCT urinalysis dipstick  Result Value Ref Range   Color, UA yellow    Clarity, UA clear    Glucose, UA negative    Bilirubin, UA negative    Ketones, UA negative    Spec Grav, UA 1.020    Blood, UA moderate    pH, UA 7.0    Protein, UA negative    Urobilinogen, UA 0.2    Nitrite, UA negative    Leukocytes, UA moderate (2+) (A) Negative  POCT urine pregnancy  Result Value Ref Range   Preg Test, Ur Negative Negative  POCT UA - Microscopic Only  Result Value Ref Range  WBC, Ur, HPF, POC 10-20    RBC, urine, microscopic 0-3    Bacteria, U Microscopic 3+    Mucus, UA negative    Epithelial cells, urine per micros 20+    Crystals, Ur, HPF, POC negative    Casts, Ur, LPF, POC negative    Yeast, UA negative         Assessment & Plan:   [name] is a 54 y.o. female Generalized abdominal pain -Constipation,Diarrhea Plan: POCT CBC, POCT urinalysis dipstick, POCT urine pregnancy, POCT UA - Microscopic Only  -Reassuring WBC on CBC. May be combination of cystitis, and underlying constipation with occasional diarrhea may be loose stool around the constipation or impaction.   -Treat cystitis with Macrobid as below, start stool softener, increase fluid and fiber in the diet, MiraLAX if needed, but if this is not helping abdominal pain and constipation, return to clinic or can refer to GI. RTC/ER precautions discussed.  Rectal bleeding,  Anal fissure - Plan: Lidocaine, Anorectal, 5 % GEL, diltiazem 2 % GEL  -Secondary to straining with constipation. See consultation as above, lidocaine gel 3 times a day when necessary, Cardizem gel 3 times a day when necessary. Handout on AVS.  Cystitis - Plan: Urine culture, nitrofurantoin, macrocrystal-monohydrate, (MACROBID) 100 MG capsule  -No true CVA tenderness. Suspect lower tract infection. Start Macrobid 100 mg twice a day 10 days, urine culture pending. RTC precautions  Meds ordered this encounter  Medications  . Lidocaine, Anorectal, 5 % GEL    Sig: Apply pea size amount to affected area 3 times a day when necessary    Dispense:  30 g    Refill:  0  . diltiazem 2 % GEL    Sig: Apply pea-sized amount to affected area 3 times a day when necessary    Dispense:  30 g    Refill:  0  . nitrofurantoin, macrocrystal-monohydrate, (MACROBID) 100 MG capsule    Sig: Take 1 capsule (100 mg total) by mouth 2 (two) times daily.    Dispense:  14 capsule    Refill:  0   Patient Instructions  You may have a urinary tract infection based on the test today.  We will send off a urine culture to make sure, but recommend you start an antibiotic twice per day. This was sent to your pharmacy.  Your abdominal pain may be due from both a urinary or bladder infection and constipation. Increase fiber in the diet, water in the diet, Colace as a stool softener over-the-counter, and if no bowel movement by the second day, can take MiraLAX over-the-counter once. See other information on constipation below.  If your abdominal pain does not improve with treatment of the constipation, fevers, or your symptoms are worsening - return here or the emergency room.   The blood you noticed is likely due to an anal fissure. We did see one on your exam today. You can apply the 2 different ointments to this area as discussed, see more information below. If this persists, return to clinic.    Urinary Tract  Infection Urinary tract infections (UTIs) can develop anywhere along your urinary tract. Your urinary tract is your body's drainage system for removing wastes and extra water. Your urinary tract includes two kidneys, two ureters, a bladder, and a urethra. Your kidneys are a pair of bean-shaped organs. Each kidney is about the size of your fist. They are located below your ribs, one on each side of your spine. CAUSES Infections are caused  by microbes, which are microscopic organisms, including fungi, viruses, and bacteria. These organisms are so small that they can only be seen through a microscope. Bacteria are the microbes that most commonly cause UTIs. SYMPTOMS  Symptoms of UTIs may vary by age and gender of the patient and by the location of the infection. Symptoms in young women typically include a frequent and intense urge to urinate and a painful, burning feeling in the bladder or urethra during urination. Older women and men are more likely to be tired, shaky, and weak and have muscle aches and abdominal pain. A fever may mean the infection is in your kidneys. Other symptoms of a kidney infection include pain in your back or sides below the ribs, nausea, and vomiting. DIAGNOSIS To diagnose a UTI, your caregiver will ask you about your symptoms. Your caregiver also will ask to provide a urine sample. The urine sample will be tested for bacteria and white blood cells. White blood cells are made by your body to help fight infection. TREATMENT  Typically, UTIs can be treated with medication. Because most UTIs are caused by a bacterial infection, they usually can be treated with the use of antibiotics. The choice of antibiotic and length of treatment depend on your symptoms and the type of bacteria causing your infection. HOME CARE INSTRUCTIONS  If you were prescribed antibiotics, take them exactly as your caregiver instructs you. Finish the medication even if you feel better after you have only taken  some of the medication.  Drink enough water and fluids to keep your urine clear or pale yellow.  Avoid caffeine, tea, and carbonated beverages. They tend to irritate your bladder.  Empty your bladder often. Avoid holding urine for long periods of time.  Empty your bladder before and after sexual intercourse.  After a bowel movement, women should cleanse from front to back. Use each tissue only once. SEEK MEDICAL CARE IF:   You have back pain.  You develop a fever.  Your symptoms do not begin to resolve within 3 days. SEEK IMMEDIATE MEDICAL CARE IF:   You have severe back pain or lower abdominal pain.  You develop chills.  You have nausea or vomiting.  You have continued burning or discomfort with urination. MAKE SURE YOU:   Understand these instructions.  Will watch your condition.  Will get help right away if you are not doing well or get worse. Document Released: 02/13/2005 Document Revised: 11/05/2011 Document Reviewed: 06/14/2011 [facility] Patient Information 2015 [place], [place]. This information is not intended to replace advice given to you by your health care provider. Make sure you discuss any questions you have with your health care provider.   Constipation Constipation is when a person has fewer than three bowel movements a week, has difficulty having a bowel movement, or has stools that are dry, hard, or larger than normal. As people grow older, constipation is more common. If you try to fix constipation with medicines that make you have a bowel movement (laxatives), the problem may get worse. Long-term laxative use may cause the muscles of the colon to become weak. A low-fiber diet, not taking in enough fluids, and taking certain medicines may make constipation worse.  CAUSES   Certain medicines, such as antidepressants, pain medicine, iron supplements, antacids, and water pills.   Certain diseases, such as diabetes, irritable bowel syndrome (IBS), thyroid  disease, or depression.   Not drinking enough water.   Not eating enough fiber-rich foods.   Stress or travel.  Lack of physical activity or exercise.   Ignoring the urge to have a bowel movement.   Using laxatives too much.  SIGNS AND SYMPTOMS   Having fewer than three bowel movements a week.   Straining to have a bowel movement.   Having stools that are hard, dry, or larger than normal.   Feeling full or bloated.   Pain in the lower abdomen.   Not feeling relief after having a bowel movement.  DIAGNOSIS  Your health care provider will take a medical history and perform a physical exam. Further testing may be done for severe constipation. Some tests may include:  A barium enema X-ray to examine your rectum, colon, and, sometimes, your small intestine.   A sigmoidoscopy to examine your lower colon.   A colonoscopy to examine your entire colon. TREATMENT  Treatment will depend on the severity of your constipation and what is causing it. Some dietary treatments include drinking more fluids and eating more fiber-rich foods. Lifestyle treatments may include regular exercise. If these diet and lifestyle recommendations do not help, your health care provider may recommend taking over-the-counter laxative medicines to help you have bowel movements. Prescription medicines may be prescribed if over-the-counter medicines do not work.  HOME CARE INSTRUCTIONS   Eat foods that have a lot of fiber, such as fruits, vegetables, whole grains, and beans.  Limit foods high in fat and processed sugars, such as french fries, hamburgers, cookies, candies, and soda.   A fiber supplement may be added to your diet if you cannot get enough fiber from foods.   Drink enough fluids to keep your urine clear or pale yellow.   Exercise regularly or as directed by your health care provider.   Go to the restroom when you have the urge to go. Do not hold it.   Only take  over-the-counter or prescription medicines as directed by your health care provider. Do not take other medicines for constipation without talking to your health care provider first.  SEEK IMMEDIATE MEDICAL CARE IF:   You have bright red blood in your stool.   Your constipation lasts for more than 4 days or gets worse.   You have abdominal or rectal pain.   You have thin, pencil-like stools.   You have unexplained weight loss. MAKE SURE YOU:   Understand these instructions.  Will watch your condition.  Will get help right away if you are not doing well or get worse. Document Released: 02/02/2004 Document Revised: 05/11/2013 Document Reviewed: 02/15/2013 [facility] Patient Information 2015 [place], [place]. This information is not intended to replace advice given to you by your health care provider. Make sure you discuss any questions you have with your health care provider.  Anal Fissure, Adult An anal fissure is a small tear or crack in the skin around the anus. Bleeding from a fissure usually stops on its own within a few minutes. However, bleeding will often reoccur with each bowel movement until the crack heals.  CAUSES   Passing large, hard stools.  Frequent diarrheal stools.  Constipation.  Inflammatory bowel disease (Crohn's disease or ulcerative colitis).  Infections.  Anal sex. SYMPTOMS   Small amounts of blood seen on your stools, on toilet paper, or in the toilet after a bowel movement.  Rectal bleeding.  Painful bowel movements.  Itching or irritation around the anus. DIAGNOSIS Your caregiver will examine the anal area. An anal fissure can usually be seen with careful inspection. A rectal exam may be performed and  a short tube (anoscope) may be used to examine the anal canal. TREATMENT   You may be instructed to take fiber supplements. These supplements can soften your stool to help make bowel movements easier.  Sitz baths may be recommended to help  heal the tear. Do not use soap in the sitz baths.  A medicated cream or ointment may be prescribed to lessen discomfort. HOME CARE INSTRUCTIONS   Maintain a diet high in fruits, whole grains, and vegetables. Avoid constipating foods like bananas and dairy products.  Take sitz baths as directed by your caregiver.  Drink enough fluids to keep your urine clear or pale yellow.  Only take over-the-counter or prescription medicines for pain, discomfort, or fever as directed by your caregiver. Do not take aspirin as this may increase bleeding.  Do not use ointments containing numbing medications (anesthetics) or hydrocortisone. They could slow healing. SEEK MEDICAL CARE IF:   Your fissure is not completely healed within 3 days.  You have further bleeding.  You have a fever.  You have diarrhea mixed with blood.  You have pain.  Your problem is getting worse rather than better. MAKE SURE YOU:   Understand these instructions.  Will watch your condition.  Will get help right away if you are not doing well or get worse. Document Released: 05/06/2005 Document Revised: 07/29/2011 Document Reviewed: 10/21/2010 [facility] Patient Information 2015 [place], [place]. This information is not intended to replace advice given to you by your health care provider. Make sure you discuss any questions you have with your health care provider.        I personally performed the services described in this documentation, which was scribed in my presence. The recorded information has been reviewed and considered, and addended by me as needed.

## 2015-01-01 NOTE — Progress Notes (Signed)
Small anal fissure about 2mm long at the 6 o'clock position.

## 2015-01-03 LAB — URINE CULTURE: Colony Count: 60000

## 2015-04-04 ENCOUNTER — Encounter: Payer: BLUE CROSS/BLUE SHIELD | Admitting: Family Medicine

## 2020-07-04 ENCOUNTER — Other Ambulatory Visit: Payer: Self-pay

## 2020-07-04 ENCOUNTER — Ambulatory Visit: Payer: 59 | Admitting: Family Medicine

## 2020-07-04 ENCOUNTER — Encounter: Payer: Self-pay | Admitting: Family Medicine

## 2020-07-04 VITALS — BP 140/89 | HR 80 | Temp 98.0°F | Ht 69.0 in | Wt 308.0 lb

## 2020-07-04 DIAGNOSIS — K649 Unspecified hemorrhoids: Secondary | ICD-10-CM | POA: Diagnosis not present

## 2020-07-04 DIAGNOSIS — Z1211 Encounter for screening for malignant neoplasm of colon: Secondary | ICD-10-CM

## 2020-07-04 DIAGNOSIS — I10 Essential (primary) hypertension: Secondary | ICD-10-CM

## 2020-07-04 MED ORDER — LIDOCAINE (ANORECTAL) 5 % EX GEL: CUTANEOUS | 0 refills | Status: AC

## 2020-07-04 MED ORDER — HYDROCORTISONE ACETATE 25 MG RE SUPP: 25.0000 mg | Freq: Two times a day (BID) | RECTAL | 0 refills | Status: DC

## 2020-07-04 NOTE — Addendum Note (Signed)
Addended by: Tobie Poet on: 07/04/2020 03:37 PM   Modules accepted: Level of Service

## 2020-07-04 NOTE — Progress Notes (Signed)
2/15/20223:35 PM  [name] 16-Feb-1961, 60 y.o., female [id number]  Chief Complaint  Patient presents with  . Leg Swelling    Bilateral feet and legs worsened and chronic   . Hemorrhoids    With constipation and some blood on tissue at times - has used prep H cream and wipes     HPI:   Patient is a 60 y.o. female with no significant past medical history  who presents today for leg swelling and hemorrhoids.   Hemorrhoids for months  Feeling more painful over the past month Occasionally notices small amount of blood on toilet paper Has never had a colonoscopy Uses prep H cream and wipes Has a BM 1-2 times per day Has had issues with anal fissures in the past  Has noticed increased leg swelling This has been a problem for years Verbalizes BP is good at home Declines wanting to be on medications at this time BP Readings from Last 3 Encounters:  07/04/20 140/89  01/01/15 126/78  03/31/13 110/70     Depression screen PHQ 2/9 07/04/2020 01/01/2015  Decreased Interest 0 0  Down, Depressed, Hopeless 0 0  PHQ - 2 Score 0 0    Fall Risk  07/04/2020  Falls in the past year? 0  Number falls in past yr: 0  Injury with Fall? 0  Follow up Falls evaluation completed     No Known Allergies  Prior to Admission medications   Medication Sig Start Date End Date Taking? Authorizing Provider  acetaminophen (TYLENOL) 500 MG chewable tablet Chew 500 mg by mouth every 4 (four) hours.   Yes [provider]  diltiazem 2 % GEL Apply pea-sized amount to affected area 3 times a day when necessary Patient not taking: Reported on 07/04/2020 01/01/15   Shade Flood, MD  Lidocaine, Anorectal, 5 % GEL Apply pea size amount to affected area 3 times a day when necessary Patient not taking: Reported on 07/04/2020 01/01/15   Shade Flood, MD    Past Medical History:  Diagnosis Date  . No pertinent past medical history     Past Surgical History:  Procedure Laterality Date   . CHOLECYSTECTOMY  08/16/2011   Procedure: LAPAROSCOPIC CHOLECYSTECTOMY WITH INTRAOPERATIVE CHOLANGIOGRAM;  Surgeon: Maisie Fus A. Cornett, MD;  Location: [facility];  Service: General;  Laterality: N/A;    Social History   Tobacco Use  . Smoking status: Never Smoker  . Smokeless tobacco: Never Used  Substance Use Topics  . Alcohol use: No    No family history on file.  Review of Systems  Constitutional: Negative for chills, fever and malaise/fatigue.  Eyes: Negative for blurred vision and double vision.  Respiratory: Negative for cough, shortness of breath and wheezing.   Cardiovascular: Positive for leg swelling. Negative for chest pain and palpitations.  Gastrointestinal: Negative for abdominal pain, blood in stool, constipation, diarrhea, heartburn, nausea and vomiting.       Rectal pain  Genitourinary: Negative for dysuria, frequency and hematuria.  Musculoskeletal: Negative for back pain and joint pain.  Skin: Negative for rash.  Neurological: Negative for dizziness, weakness and headaches.     OBJECTIVE:  Today's Vitals   07/04/20 1504  BP: 140/89  Pulse: 80  Temp: 98 F (36.7 C)  SpO2: 97%  Weight: (!) 308 lb (139.7 kg)  Height: 5\' 9"  (1.753 m)   Body mass index is 45.48 kg/m.   Physical Exam Constitutional:      General: She is not in acute distress.  Appearance: Normal appearance. She is not ill-appearing.  HENT:     Head: Normocephalic.  Cardiovascular:     Rate and Rhythm: Normal rate and regular rhythm.     Pulses: Normal pulses.     Heart sounds: Normal heart sounds. No murmur heard. No friction rub. No gallop.   Pulmonary:     Effort: Pulmonary effort is normal. No respiratory distress.     Breath sounds: Normal breath sounds. No stridor. No wheezing, rhonchi or rales.  Abdominal:     General: Bowel sounds are normal.     Palpations: Abdomen is soft.     Tenderness: There is no abdominal tenderness.  Genitourinary:    Comments: Hemorrhoids  noted around rectum, no fissures or drainage Musculoskeletal:     Right lower leg: Edema present.     Left lower leg: Edema present.  Skin:    General: Skin is warm and dry.  Neurological:     Mental Status: She is alert and oriented to person, place, and time.  Psychiatric:        Mood and Affect: Mood normal.        Behavior: Behavior normal.     No results found for this or any previous visit (from the past 24 hour(s)).  No results found.   ASSESSMENT and PLAN  Problem List Items Addressed This Visit   None   Visit Diagnoses    Hemorrhoids, unspecified hemorrhoid type    -  Primary   Relevant Medications   Lidocaine, Anorectal, 5 % GEL   hydrocortisone (ANUSOL-HC) 25 MG suppository   Screen for colon cancer       Relevant Orders   Ambulatory referral to Gastroenterology   Essential hypertension          Plan . Declined labs and medications at this time for HTN, r/se/b discussed . Continue OTC treatments for hemorrhoids and to prevent constipation . Disucssed the use of Lidocaine ointment and anusol . Referral placed to GI for colonoscopy . Declined mammogram at this time, r/se/b discussed . RTC/ED precautions provided   Return in about 1 month (around 08/01/2020).    Ann Carls Rickelle Sylvestre, FNP-BC Primary Care at [facility] [address] [place], [place] [address] Ph.  [phone] Fax [phone]

## 2020-07-04 NOTE — Patient Instructions (Addendum)
Hemorrhoids Hemorrhoids are swollen veins that may develop:  In the butt (rectum). These are called internal hemorrhoids.  Around the opening of the butt (anus). These are called external hemorrhoids. Hemorrhoids can cause pain, itching, or bleeding. Most of the time, they do not cause serious problems. They usually get better with diet changes, lifestyle changes, and other home treatments. What are the causes? This condition may be caused by:  Having trouble pooping (constipation).  Pushing hard (straining) to poop.  Watery poop (diarrhea).  Pregnancy.  Being very overweight (obese).  Sitting for long periods of time.  Heavy lifting or other activity that causes you to strain.  Anal sex.  Riding a bike for a long period of time. What are the signs or symptoms? Symptoms of this condition include:  Pain.  Itching or soreness in the butt.  Bleeding from the butt.  Leaking poop.  Swelling in the area.  One or more lumps around the opening of your butt. How is this diagnosed? A doctor can often diagnose this condition by looking at the affected area. The doctor may also:  Do an exam that involves feeling the area with a gloved hand (digital rectal exam).  Examine the area inside your butt using a small tube (anoscope).  Order blood tests. This may be done if you have lost a lot of blood.  Have you get a test that involves looking inside the colon using a flexible tube with a camera on the end (sigmoidoscopy or colonoscopy). How is this treated? This condition can usually be treated at home. Your doctor may tell you to change what you eat, make lifestyle changes, or try home treatments. If these do not help, procedures can be done to remove the hemorrhoids or make them smaller. These may involve:  Placing rubber bands at the base of the hemorrhoids to cut off their blood supply.  Injecting medicine into the hemorrhoids to shrink them.  Shining a type of  light energy onto the hemorrhoids to cause them to fall off.  Doing surgery to remove the hemorrhoids or cut off their blood supply. Follow these instructions at home: Eating and drinking  Eat foods that have a lot of fiber in them. These include whole grains, beans, nuts, fruits, and vegetables.  Ask your doctor about taking products that have added fiber (fibersupplements).  Reduce the amount of fat in your diet. You can do this by: ? Eating low-fat dairy products. ? Eating less red meat. ? Avoiding processed foods.  Drink enough fluid to keep your pee (urine) pale yellow.   Managing pain and swelling  Take a warm-water bath (sitz bath) for 20 minutes to ease pain. Do this 3-4 times a day. You may do this in a bathtub or using a portable sitz bath that fits over the toilet.  If told, put ice on the painful area. It may be helpful to use ice between your warm baths. ? Put ice in a plastic bag. ? Place a towel between your skin and the bag. ? Leave the ice on for 20 minutes, 2-3 times a day.   General instructions  Take over-the-counter and prescription medicines only as told by your doctor. ? Medicated creams and medicines may be used as told.  Exercise often. Ask your doctor how much and what kind of exercise is best for you.  Go to the bathroom when you have the urge to poop. Do not wait.  Avoid pushing too hard when you  poop.  Keep your butt dry and clean. Use wet toilet paper or moist towelettes after pooping.  Do not sit on the toilet for a long time.  Keep all follow-up visits as told by your doctor. This is important. Contact a doctor if you:  Have pain and swelling that do not get better with treatment or medicine.  Have trouble pooping.  Cannot poop.  Have pain or swelling outside the area of the hemorrhoids. Get help right away if you have:  Bleeding that will not stop. Summary  Hemorrhoids are swollen veins in the butt or around the opening of the  butt.  They can cause pain, itching, or bleeding.  Eat foods that have a lot of fiber in them. These include whole grains, beans, nuts, fruits, and vegetables.  Take a warm-water bath (sitz bath) for 20 minutes to ease pain. Do this 3-4 times a day. This information is not intended to replace advice given to you by your health care provider. Make sure you discuss any questions you have with your health care provider. Document Revised: 05/14/2018 Document Reviewed: 09/25/2017 Elsevier Patient Education  2021 [employer].   If you have lab work done today you will be contacted with your lab results within the next 2 weeks.  If you have not heard from [place] then please contact us. The fastest way to get your results is to register for My Chart.   IF you received an x-ray today, you will receive an invoice from [facility]. Please contact [facility] at [phone] with questions or concerns regarding your invoice.   IF you received labwork today, you will receive an invoice from [facility]. Please contact [facility] at [phone] with questions or concerns regarding your invoice.   Our billing staff will not be able to assist you with questions regarding bills from these companies.  You will be contacted with the lab results as soon as they are available. The fastest way to get your results is to activate your My Chart account. Instructions are located on the last page of this paperwork. If you have not heard from [place] regarding the results in 2 weeks, please contact this office.

## 2020-07-05 ENCOUNTER — Telehealth: Payer: Self-pay | Admitting: Family Medicine

## 2020-07-05 NOTE — Telephone Encounter (Signed)
Call to [facility] to check to see if they possibly have rx before requesting Rx reorder- they do not have Anusol available. Request sent to office for alternative Rx review

## 2020-07-05 NOTE — Telephone Encounter (Signed)
PT contacted the [facility] pharmacy and they are out of stock of both medications. PT requested either a generic or alternate option be sent to a different pharmacy.   hydrocortisone (ANUSOL-HC) 25 MG suppository  Lidocaine, Anorectal, 5 % GEL  Preferred Pharmacy:  [facility] [address] [place], [place], [place] [address] Phone: [phone]

## 2020-07-06 NOTE — Telephone Encounter (Signed)
Called pharmacy and they stated the rx would be in today they had to order it.

## 2020-07-06 NOTE — Telephone Encounter (Signed)
I am not sure what substitute they have available. Please call her pharmacy and let me know. Thanks!

## 2020-07-06 NOTE — Telephone Encounter (Signed)
Is there an alternative medication for patient?

## 2020-07-13 ENCOUNTER — Telehealth: Payer: Self-pay | Admitting: *Deleted

## 2020-07-13 NOTE — Telephone Encounter (Signed)
Called in a verbal for hydrocortisone acetate rectal suppository 30 mg per Just to replace denial of anucort hc.  Lm for patient

## 2020-08-01 ENCOUNTER — Ambulatory Visit: Payer: 59 | Admitting: Family Medicine

## 2020-08-01 ENCOUNTER — Other Ambulatory Visit: Payer: Self-pay | Admitting: Family Medicine

## 2020-08-01 DIAGNOSIS — K649 Unspecified hemorrhoids: Secondary | ICD-10-CM

## 2020-08-01 MED ORDER — HYDROCORTISONE ACETATE 30 MG RE SUPP: 1.0000 | Freq: Two times a day (BID) | RECTAL | 0 refills | Status: AC

## 2020-12-25 ENCOUNTER — Other Ambulatory Visit: Payer: Self-pay | Admitting: Family

## 2020-12-25 DIAGNOSIS — N950 Postmenopausal bleeding: Secondary | ICD-10-CM

## 2021-01-01 ENCOUNTER — Other Ambulatory Visit: Payer: Self-pay | Admitting: Obstetrics & Gynecology

## 2021-01-01 DIAGNOSIS — Z1231 Encounter for screening mammogram for malignant neoplasm of breast: Secondary | ICD-10-CM

## 2021-02-20 ENCOUNTER — Telehealth: Payer: Self-pay

## 2021-02-20 NOTE — Telephone Encounter (Signed)
Called the pt to tell [facility] that [facility] is [facility] to get her mammo scheduled

## 2022-03-18 ENCOUNTER — Telehealth: Payer: Self-pay

## 2022-03-18 NOTE — Telephone Encounter (Signed)
Telephoned patient at home number. Left a voice message with [facility] contact information.
